# Patient Record
Sex: Female | Born: 1980
Health system: Southern US, Community
[De-identification: ages and names within clinical notes are randomized; demographics above are authoritative.]

## PROBLEM LIST (undated history)

## (undated) DIAGNOSIS — E049 Nontoxic goiter, unspecified: Secondary | ICD-10-CM

## (undated) DIAGNOSIS — Z789 Other specified health status: Secondary | ICD-10-CM

## (undated) DIAGNOSIS — N809 Endometriosis, unspecified: Secondary | ICD-10-CM

## (undated) DIAGNOSIS — A630 Anogenital (venereal) warts: Secondary | ICD-10-CM

## (undated) DIAGNOSIS — Z8619 Personal history of other infectious and parasitic diseases: Secondary | ICD-10-CM

## (undated) DIAGNOSIS — N83209 Unspecified ovarian cyst, unspecified side: Secondary | ICD-10-CM

## (undated) DIAGNOSIS — R87629 Unspecified abnormal cytological findings in specimens from vagina: Secondary | ICD-10-CM

## (undated) DIAGNOSIS — N889 Noninflammatory disorder of cervix uteri, unspecified: Secondary | ICD-10-CM

## (undated) DIAGNOSIS — K529 Noninfective gastroenteritis and colitis, unspecified: Secondary | ICD-10-CM

## (undated) HISTORY — DX: Personal history of other infectious and parasitic diseases: Z86.19

## (undated) HISTORY — PX: BUNIONECTOMY: SHX129

## (undated) HISTORY — DX: Nontoxic goiter, unspecified: E04.9

## (undated) HISTORY — DX: Unspecified abnormal cytological findings in specimens from vagina: R87.629

## (undated) HISTORY — PX: WISDOM TOOTH EXTRACTION: SHX21

## (undated) HISTORY — DX: Unspecified ovarian cyst, unspecified side: N83.209

## (undated) HISTORY — PX: BREAST LUMPECTOMY: SHX2

---

## 2003-10-18 ENCOUNTER — Other Ambulatory Visit: Admission: RE | Admit: 2003-10-18 | Discharge: 2003-10-18 | Payer: Self-pay | Admitting: Obstetrics and Gynecology

## 2007-04-03 ENCOUNTER — Encounter: Admission: RE | Admit: 2007-04-03 | Discharge: 2007-04-03 | Payer: Self-pay | Admitting: Obstetrics and Gynecology

## 2007-04-28 ENCOUNTER — Emergency Department (HOSPITAL_COMMUNITY): Admission: EM | Admit: 2007-04-28 | Discharge: 2007-04-28 | Payer: Self-pay | Admitting: Family Medicine

## 2007-04-29 ENCOUNTER — Emergency Department (HOSPITAL_COMMUNITY): Admission: EM | Admit: 2007-04-29 | Discharge: 2007-04-29 | Payer: Self-pay | Admitting: Family Medicine

## 2009-07-21 ENCOUNTER — Encounter: Admission: RE | Admit: 2009-07-21 | Discharge: 2009-07-21 | Payer: Self-pay | Admitting: Obstetrics and Gynecology

## 2010-12-09 ENCOUNTER — Emergency Department (HOSPITAL_BASED_OUTPATIENT_CLINIC_OR_DEPARTMENT_OTHER)
Admission: EM | Admit: 2010-12-09 | Discharge: 2010-12-09 | Disposition: A | Discharge status: Home / Self Care | Payer: 59 | Attending: Emergency Medicine | Admitting: Emergency Medicine

## 2010-12-09 ENCOUNTER — Emergency Department (INDEPENDENT_AMBULATORY_CARE_PROVIDER_SITE_OTHER): Payer: 59

## 2010-12-09 DIAGNOSIS — R110 Nausea: Secondary | ICD-10-CM | POA: Insufficient documentation

## 2010-12-09 DIAGNOSIS — R1031 Right lower quadrant pain: Secondary | ICD-10-CM

## 2010-12-09 DIAGNOSIS — R109 Unspecified abdominal pain: Secondary | ICD-10-CM | POA: Insufficient documentation

## 2010-12-09 LAB — DIFFERENTIAL
Eosinophils Absolute: 0 10*3/uL (ref 0.0–0.7)
Eosinophils Relative: 0 % (ref 0–5)
Lymphs Abs: 1.5 10*3/uL (ref 0.7–4.0)
Monocytes Absolute: 1.2 10*3/uL — ABNORMAL HIGH (ref 0.1–1.0)

## 2010-12-09 LAB — URINALYSIS, ROUTINE W REFLEX MICROSCOPIC
Leukocytes, UA: NEGATIVE
Nitrite: NEGATIVE
Urine Glucose, Fasting: NEGATIVE mg/dL
pH: 6.5 (ref 5.0–8.0)

## 2010-12-09 LAB — COMPREHENSIVE METABOLIC PANEL
Albumin: 4.1 g/dL (ref 3.5–5.2)
Alkaline Phosphatase: 51 U/L (ref 39–117)
BUN: 11 mg/dL (ref 6–23)
Calcium: 9.3 mg/dL (ref 8.4–10.5)
Creatinine, Ser: 0.8 mg/dL (ref 0.4–1.2)
Glucose, Bld: 99 mg/dL (ref 70–99)
Potassium: 3.3 mEq/L — ABNORMAL LOW (ref 3.5–5.1)
Total Protein: 7.7 g/dL (ref 6.0–8.3)

## 2010-12-09 LAB — CBC
HCT: 35.7 % — ABNORMAL LOW (ref 36.0–46.0)
MCHC: 34.2 g/dL (ref 30.0–36.0)
MCV: 88.1 fL (ref 78.0–100.0)
Platelets: 235 10*3/uL (ref 150–400)
RDW: 11.7 % (ref 11.5–15.5)
WBC: 12.2 10*3/uL — ABNORMAL HIGH (ref 4.0–10.5)

## 2010-12-09 LAB — URINE MICROSCOPIC-ADD ON

## 2010-12-09 LAB — LIPASE, BLOOD: Lipase: 70 U/L (ref 23–300)

## 2010-12-09 LAB — PREGNANCY, URINE: Preg Test, Ur: NEGATIVE

## 2010-12-09 MED ORDER — IOHEXOL 300 MG/ML  SOLN
100.0000 mL | Freq: Once | INTRAMUSCULAR | Status: AC | PRN
  Administered 2010-12-09: 100 mL via INTRAVENOUS

## 2011-08-15 LAB — I-STAT 8, (EC8 V) (CONVERTED LAB)
Acid-Base Excess: 1
BUN: 13
Bicarbonate: 24.7 — ABNORMAL HIGH
Chloride: 104
Glucose, Bld: 96
HCT: 42
Hemoglobin: 14.3
Operator id: 247071
Potassium: 3.7
Sodium: 137
TCO2: 26
pCO2, Ven: 34.3 — ABNORMAL LOW
pH, Ven: 7.465 — ABNORMAL HIGH

## 2011-08-15 LAB — DIFFERENTIAL
Basophils Absolute: 0
Basophils Relative: 0
Eosinophils Absolute: 0.1
Eosinophils Relative: 1
Lymphocytes Relative: 12
Lymphs Abs: 0.8
Monocytes Absolute: 0.4
Monocytes Relative: 5
Neutro Abs: 5.6
Neutrophils Relative %: 82 — ABNORMAL HIGH

## 2011-08-15 LAB — POCT URINALYSIS DIP (DEVICE)
Bilirubin Urine: NEGATIVE
Glucose, UA: NEGATIVE
Ketones, ur: NEGATIVE
Nitrite: NEGATIVE
Operator id: 247071
Protein, ur: NEGATIVE
Specific Gravity, Urine: 1.02
Urobilinogen, UA: 0.2
pH: 7.5

## 2011-08-15 LAB — CBC
HCT: 36.6
Hemoglobin: 12.4
MCHC: 33.9
MCV: 88.9
Platelets: 253
RBC: 4.12
RDW: 11.9
WBC: 6.9

## 2011-08-15 LAB — SEDIMENTATION RATE: Sed Rate: 16

## 2011-08-15 LAB — POCT RAPID STREP A: Streptococcus, Group A Screen (Direct): NEGATIVE

## 2011-12-04 ENCOUNTER — Other Ambulatory Visit: Payer: Self-pay | Admitting: Obstetrics and Gynecology

## 2011-12-04 DIAGNOSIS — N63 Unspecified lump in unspecified breast: Secondary | ICD-10-CM

## 2011-12-07 ENCOUNTER — Ambulatory Visit
Admission: RE | Admit: 2011-12-07 | Discharge: 2011-12-07 | Disposition: A | Discharge status: Home / Self Care | Payer: 59 | Source: Ambulatory Visit | Attending: Obstetrics and Gynecology | Admitting: Obstetrics and Gynecology

## 2011-12-07 DIAGNOSIS — N63 Unspecified lump in unspecified breast: Secondary | ICD-10-CM

## 2012-02-29 ENCOUNTER — Emergency Department (INDEPENDENT_AMBULATORY_CARE_PROVIDER_SITE_OTHER)
Admission: EM | Admit: 2012-02-29 | Discharge: 2012-02-29 | Disposition: A | Discharge status: Home / Self Care | Payer: 59 | Source: Home / Self Care | Attending: Emergency Medicine | Admitting: Emergency Medicine

## 2012-02-29 ENCOUNTER — Encounter (HOSPITAL_COMMUNITY): Payer: Self-pay | Admitting: *Deleted

## 2012-02-29 DIAGNOSIS — K2900 Acute gastritis without bleeding: Secondary | ICD-10-CM

## 2012-02-29 DIAGNOSIS — K2970 Gastritis, unspecified, without bleeding: Secondary | ICD-10-CM

## 2012-02-29 LAB — POCT URINALYSIS DIP (DEVICE)
Leukocytes, UA: NEGATIVE
Nitrite: NEGATIVE
Protein, ur: NEGATIVE mg/dL
pH: 6 (ref 5.0–8.0)

## 2012-02-29 LAB — POCT PREGNANCY, URINE: Preg Test, Ur: NEGATIVE

## 2012-02-29 LAB — POCT H PYLORI SCREEN: H. PYLORI SCREEN, POC: NEGATIVE

## 2012-02-29 MED ORDER — PANTOPRAZOLE SODIUM 40 MG PO TBEC: 40.0000 mg | DELAYED_RELEASE_TABLET | Freq: Every day | ORAL | Status: DC

## 2012-02-29 MED ORDER — SUCRALFATE 1 GM/10ML PO SUSP: 1.0000 g | Freq: Four times a day (QID) | ORAL | Status: DC

## 2012-02-29 MED ORDER — FAMOTIDINE 20 MG PO TABS: 20.0000 mg | ORAL_TABLET | Freq: Every day | ORAL | Status: DC

## 2012-02-29 NOTE — Discharge Instructions (Signed)
Gastritis Gastritis is an inflammation (the body's way of reacting to injury and/or infection) of the stomach. It is often caused by viral or bacterial (germ) infections. It can also be caused by chemicals (including alcohol) and medications. This illness may be associated with generalized malaise (feeling tired, not well), cramps, and fever. The illness may last 2 to 7 days. If symptoms of gastritis continue, gastroscopy (looking into the stomach with a telescope-like instrument), biopsy (taking tissue samples), and/or blood tests may be necessary to determine the cause. Antibiotics will not affect the illness unless there is a bacterial infection present. One common bacterial cause of gastritis is an organism known as H. Pylori. This can be treated with antibiotics. Other forms of gastritis are caused by too much acid in the stomach. They can be treated with medications such as H2 blockers and antacids. Home treatment is usually all that is needed. Young children will quickly become dehydrated (loss of body fluids) if vomiting and diarrhea are both present. Medications may be given to control nausea. Medications are usually not given for diarrhea unless especially bothersome. Some medications slow the removal of the virus from the gastrointestinal tract. This slows down the healing process. HOME CARE INSTRUCTIONS Home care instructions for nausea and vomiting:  For adults: drink small amounts of fluids often. Drink at least 2 quarts a day. Take sips frequently. Do not drink large amounts of fluid at one time. This may worsen the nausea.   Only take over-the-counter or prescription medicines for pain, discomfort, or fever as directed by your caregiver.   Drink clear liquids only. Those are anything you can see through such as water, broth, or soft drinks.   Once you are keeping clear liquids down, you may start full liquids, soups, juices, and ice cream or sherbet. Slowly add bland (plain, not spicy)  foods to your diet.  Home care instructions for diarrhea:  Diarrhea can be caused by bacterial infections or a virus. Your condition should improve with time, rest, fluids, and/or anti-diarrheal medication.   Until your diarrhea is under control, you should drink clear liquids often in small amounts. Clear liquids include: water, broth, jell-o water and weak tea.  Avoid:  Milk.   Fruits.   Tobacco.   Alcohol.   Extremely hot or cold fluids.   Too much intake of anything at one time.  When your diarrhea stops you may add the following foods, which help the stool to become more formed:  Rice.   Bananas.   Apples without skin.   Dry toast.  Once these foods are tolerated you may add low-fat yogurt and low-fat cottage cheese. They will help to restore the normal bacterial balance in your bowel. Wash your hands well to avoid spreading bacteria (germ) or virus. SEEK IMMEDIATE MEDICAL CARE IF:   You are unable to keep fluids down.   Vomiting or diarrhea become persistent (constant).   Abdominal pain develops, increases, or localizes. (Right sided pain can be appendicitis. Left sided pain in adults can be diverticulitis.)   You develop a fever (an oral temperature above 102 F (38.9 C)).   Diarrhea becomes excessive or contains blood or mucus.   You have excessive weakness, dizziness, fainting or extreme thirst.   You are not improving or you are getting worse.   You have any other questions or concerns.  Document Released: 10/09/2001 Document Revised: 10/04/2011 Document Reviewed: 10/15/2005 [employer] Patient Information 2012 [employer]. 

## 2012-02-29 NOTE — ED Provider Notes (Signed)
History     CSN: [id number]  Arrival date & time 02/29/12  1846   First MD Initiated Contact with Patient 02/29/12 1859      Chief Complaint  Patient presents with  . Abdominal Pain    (Consider location/radiation/quality/duration/timing/severity/associated sxs/prior treatment) HPI Comments: Patient reports burning, left upper/gastric pain, nausea, decreased appetite starting several days ago. Last night states it woke her up from sleep, and she reports increased belching. She states she has this every month around menses for the past year.  otherwise does not have any symptoms during the rest of the month. States her symptoms are worse with eating red foods, such as tomatoes, ketchup, red wine, etc. and with ibuprofen. She has decreased her intake of these, with some improvement this month. Chest tried some Pepto-Bismol earlier today with significant relief. No vomiting, fevers, abdominal distention. No chest pain coughing, wheezing, shortness breath, sore throat. No urinary or vaginal complaints. She is currently on menses. Last bowel movement was today, was normal for her. She does not smoke. Has no history of gastritis, H. pylori infection.   ROS as noted in HPI. All other ROS negative.   Patient is a 32 y.o. female presenting with abdominal pain. The history is provided by the patient. No language interpreter was used.  Abdominal Pain The primary symptoms of the illness include abdominal pain.    History reviewed. No pertinent past medical history.  Past Surgical History  Procedure Date  . Breast lumpectomy     Family History  Problem Relation Age of Onset  . Diabetes Mother   . Hypertension Mother     History  Substance Use Topics  . Smoking status: Never Smoker   . Smokeless tobacco: Not on file  . Alcohol Use: Yes     occasonally    OB History    Grav Para Term Preterm Abortions TAB SAB Ect Mult Living                  Review of Systems  Gastrointestinal:  Positive for abdominal pain.    Allergies  Review of patient's allergies indicates no known allergies.  Home Medications   Current Outpatient Rx  Name Route Sig Dispense Refill  . BISMUTH SUBSALICYLATE 262 MG/15ML PO SUSP Oral Take 15 mLs by mouth every 6 (six) hours as needed.    Marland Kitchen NAPROXEN SODIUM 220 MG PO TABS Oral Take 220 mg by mouth 2 (two) times daily with a meal.    . FAMOTIDINE 20 MG PO TABS Oral Take 1 tablet (20 mg total) by mouth daily. 20 tablet 0  . PANTOPRAZOLE SODIUM 40 MG PO TBEC Oral Take 1 tablet (40 mg total) by mouth daily. 20 tablet 0  . SUCRALFATE 1 GM/10ML PO SUSP Oral Take 10 mLs (1 g total) by mouth 4 (four) times daily. 10 mL before meals and before bedtime 240 mL 0    BP 125/77  Pulse 74  Temp(Src) 97.5 F (36.4 C) (Oral)  Resp 18  SpO2 100%  LMP 02/29/2012  Physical Exam  Nursing note and vitals reviewed. Constitutional: She is oriented to person, place, and time. She appears well-developed and well-nourished.  HENT:  Head: Normocephalic and atraumatic.  Eyes: Conjunctivae and EOM are normal.  Neck: Normal range of motion.  Cardiovascular: Normal rate, regular rhythm, normal heart sounds and intact distal pulses.   Pulmonary/Chest: Effort normal and breath sounds normal.  Abdominal: Soft. Normal appearance and bowel sounds are normal. She exhibits no distension  and no mass. There is no rebound and no guarding.    Musculoskeletal: Normal range of motion.  Neurological: She is alert and oriented to person, place, and time.  Skin: Skin is warm and dry.  Psychiatric: She has a normal mood and affect. Her behavior is normal. Judgment and thought content normal.    ED Course  Procedures (including critical care time)  Labs Reviewed  POCT URINALYSIS DIP (DEVICE) - Abnormal; Notable for the following:    Ketones, ur 15 (*)    Hgb urine dipstick LARGE (*)    All other components within normal limits  POCT H PYLORI SCREEN  POCT PREGNANCY, URINE     No results found.   1. Gastritis    Results for orders placed during the hospital encounter of 02/29/12  POCT H PYLORI SCREEN      Component Value Range   H. PYLORI SCREEN, POC NEGATIVE  NEGATIVE   POCT URINALYSIS DIP (DEVICE)      Component Value Range   Glucose, UA NEGATIVE  NEGATIVE (mg/dL)   Bilirubin Urine NEGATIVE  NEGATIVE    Ketones, ur 15 (*) NEGATIVE (mg/dL)   Specific Gravity, Urine 1.010  1.005 - 1.030    Hgb urine dipstick LARGE (*) NEGATIVE    pH 6.0  5.0 - 8.0    Protein, ur NEGATIVE  NEGATIVE (mg/dL)   Urobilinogen, UA 0.2  0.0 - 1.0 (mg/dL)   Nitrite NEGATIVE  NEGATIVE    Leukocytes, UA NEGATIVE  NEGATIVE   POCT PREGNANCY, URINE      Component Value Range   Preg Test, Ur NEGATIVE  NEGATIVE      MDM  Previous labs, records reviewed. Normal abdomen CT on ER evaluation for lower abdominal pain on 11/2010.  Abdomen benign. No evidence of surgical abdomen. H&P most consistent with gastritis, most likely from NSAID ingestion. She has already decrease the amount of NSAIDs she takes normally, and states that her symptoms are not as bad this month. H. pylori negative. Sending home with Carafate, H2, PPI. She'll follow with her PMD as needed. Patient agrees with plan  Luiz Blare, MD 02/29/12 2159

## 2012-02-29 NOTE — ED Notes (Signed)
Pt  Reports  Symptoms  Of  Belching  As  Well as  Burning  Sensation  In  Stomach  -  Pt  Reports  The  Symptoms      Have  Persisted  Since  yest  She  Reports  She  Has  Had  Similar  Symptoms  Before     -  She  Reports  The  Symptoms  Typical;ly  Occur  Around  Her  Period

## 2012-04-28 ENCOUNTER — Encounter (HOSPITAL_COMMUNITY): Payer: Self-pay | Admitting: *Deleted

## 2012-04-28 ENCOUNTER — Emergency Department (HOSPITAL_COMMUNITY)
Admission: EM | Admit: 2012-04-28 | Discharge: 2012-04-28 | Disposition: A | Discharge status: Home / Self Care | Payer: Self-pay | Attending: Emergency Medicine | Admitting: Emergency Medicine

## 2012-04-28 ENCOUNTER — Emergency Department (HOSPITAL_COMMUNITY): Payer: Self-pay

## 2012-04-28 DIAGNOSIS — R109 Unspecified abdominal pain: Secondary | ICD-10-CM | POA: Insufficient documentation

## 2012-04-28 DIAGNOSIS — N83209 Unspecified ovarian cyst, unspecified side: Secondary | ICD-10-CM | POA: Insufficient documentation

## 2012-04-28 DIAGNOSIS — N83201 Unspecified ovarian cyst, right side: Secondary | ICD-10-CM

## 2012-04-28 DIAGNOSIS — R112 Nausea with vomiting, unspecified: Secondary | ICD-10-CM | POA: Insufficient documentation

## 2012-04-28 HISTORY — DX: Noninfective gastroenteritis and colitis, unspecified: K52.9

## 2012-04-28 HISTORY — DX: Noninflammatory disorder of cervix uteri, unspecified: N88.9

## 2012-04-28 LAB — COMPREHENSIVE METABOLIC PANEL
Albumin: 4.4 g/dL (ref 3.5–5.2)
Alkaline Phosphatase: 35 U/L — ABNORMAL LOW (ref 39–117)
BUN: 7 mg/dL (ref 6–23)
Calcium: 9.8 mg/dL (ref 8.4–10.5)
Creatinine, Ser: 0.68 mg/dL (ref 0.50–1.10)
GFR calc Af Amer: >90 mL/min (ref >90)
Potassium: 3.6 mEq/L (ref 3.5–5.1)
Total Protein: 8.7 g/dL — ABNORMAL HIGH (ref 6.0–8.3)

## 2012-04-28 LAB — URINALYSIS, ROUTINE W REFLEX MICROSCOPIC
Bilirubin Urine: NEGATIVE
Ketones, ur: NEGATIVE mg/dL
Nitrite: NEGATIVE
Protein, ur: NEGATIVE mg/dL

## 2012-04-28 LAB — CBC
Hemoglobin: 13.1 g/dL (ref 12.0–15.0)
MCH: 29.5 pg (ref 26.0–34.0)
MCHC: 33.3 g/dL (ref 30.0–36.0)
MCV: 88.5 fL (ref 78.0–100.0)
Platelets: 297 10*3/uL (ref 150–400)

## 2012-04-28 LAB — LIPASE, BLOOD: Lipase: 28 U/L (ref 11–59)

## 2012-04-28 LAB — DIFFERENTIAL
Basophils Relative: 0 % (ref 0–1)
Eosinophils Absolute: 0 10*3/uL (ref 0.0–0.7)
Eosinophils Relative: 0 % (ref 0–5)
Monocytes Relative: 5 % (ref 3–12)
Neutrophils Relative %: 79 % — ABNORMAL HIGH (ref 43–77)

## 2012-04-28 LAB — WET PREP, GENITAL
Clue Cells Wet Prep HPF POC: NONE SEEN
Trich, Wet Prep: NONE SEEN
Yeast Wet Prep HPF POC: NONE SEEN

## 2012-04-28 LAB — URINE MICROSCOPIC-ADD ON

## 2012-04-28 MED ORDER — OXYCODONE-ACETAMINOPHEN 5-325 MG PO TABS: ORAL_TABLET | ORAL | Status: DC

## 2012-04-28 MED ORDER — CEFTRIAXONE SODIUM 250 MG IJ SOLR
250.0000 mg | Freq: Once | INTRAMUSCULAR | Status: AC
  Administered 2012-04-28: 250 mg via INTRAMUSCULAR
  Filled 2012-04-28: qty 250

## 2012-04-28 MED ORDER — DOXYCYCLINE HYCLATE 100 MG PO TABS
100.0000 mg | ORAL_TABLET | Freq: Once | ORAL | Status: AC
  Administered 2012-04-28: 100 mg via ORAL
  Filled 2012-04-28: qty 1

## 2012-04-28 MED ORDER — ONDANSETRON HCL 4 MG PO TABS: 4.0000 mg | ORAL_TABLET | Freq: Four times a day (QID) | ORAL | Status: AC

## 2012-04-28 MED ORDER — MORPHINE SULFATE 4 MG/ML IJ SOLN
4.0000 mg | Freq: Once | INTRAMUSCULAR | Status: AC
  Administered 2012-04-28: 4 mg via INTRAVENOUS
  Filled 2012-04-28: qty 1

## 2012-04-28 MED ORDER — SODIUM CHLORIDE 0.9 % IV BOLUS (SEPSIS)
1000.0000 mL | Freq: Once | INTRAVENOUS | Status: AC
  Administered 2012-04-28: 1000 mL via INTRAVENOUS

## 2012-04-28 MED ORDER — IOHEXOL 300 MG/ML  SOLN
100.0000 mL | Freq: Once | INTRAMUSCULAR | Status: AC | PRN
  Administered 2012-04-28: 100 mL via INTRAVENOUS

## 2012-04-28 MED ORDER — METRONIDAZOLE 500 MG PO TABS
500.0000 mg | ORAL_TABLET | Freq: Once | ORAL | Status: AC
  Administered 2012-04-28: 500 mg via ORAL
  Filled 2012-04-28: qty 1

## 2012-04-28 MED ORDER — SODIUM CHLORIDE 0.9 % IV SOLN
Freq: Once | INTRAVENOUS | Status: AC
  Administered 2012-04-28: 500 mL via INTRAVENOUS

## 2012-04-28 MED ORDER — DOXYCYCLINE HYCLATE 100 MG PO TABS: 100.0000 mg | ORAL_TABLET | Freq: Two times a day (BID) | ORAL | Status: AC

## 2012-04-28 MED ORDER — METRONIDAZOLE 500 MG PO TABS: 500.0000 mg | ORAL_TABLET | Freq: Two times a day (BID) | ORAL | Status: AC

## 2012-04-28 NOTE — Discharge Instructions (Signed)
Ovarian Cyst The ovaries are small organs that are on each side of the uterus. The ovaries are the organs that produce the female hormones, estrogen and progesterone. An ovarian cyst is a sac filled with fluid that can vary in its size. It is normal for a small cyst to form in women who are in the childbearing age and who have menstrual periods. This type of cyst is called a follicle cyst that becomes an ovulation cyst (corpus luteum cyst) after it produces the women's egg. It later goes away on its own if the woman does not become pregnant. There are other kinds of ovarian cysts that may cause problems and may need to be treated. The most serious problem is a cyst with cancer. It should be noted that menopausal women who have an ovarian cyst are at a higher risk of it being a cancer cyst. They should be evaluated very quickly, thoroughly and followed closely. This is especially true in menopausal women because of the high rate of ovarian cancer in women in menopause. CAUSES AND TYPES OF OVARIAN CYSTS:  FUNCTIONAL CYST: The follicle/corpus luteum cyst is a functional cyst that occurs every month during ovulation with the menstrual cycle. They go away with the next menstrual cycle if the woman does not get pregnant. Usually, there are no symptoms with a functional cyst.   ENDOMETRIOMA CYST: This cyst develops from the lining of the uterus tissue. This cyst gets in or on the ovary. It grows every month from the bleeding during the menstrual period. It is also called a "chocolate cyst" because it becomes filled with blood that turns brown. This cyst can cause pain in the lower abdomen during intercourse and with your menstrual period.   CYSTADENOMA CYST: This cyst develops from the cells on the outside of the ovary. They usually are not cancerous. They can get very big and cause lower abdomen pain and pain with intercourse. This type of cyst can twist on itself, cut off its blood supply and cause severe pain.  It also can easily rupture and cause a lot of pain.   DERMOID CYST: This type of cyst is sometimes found in both ovaries. They are found to have different kinds of body tissue in the cyst. The tissue includes skin, teeth, hair, and/or cartilage. They usually do not have symptoms unless they get very big. Dermoid cysts are rarely cancerous.   POLYCYSTIC OVARY: This is a rare condition with hormone problems that produces many small cysts on both ovaries. The cysts are follicle-like cysts that never produce an egg and become a corpus luteum. It can cause an increase in body weight, infertility, acne, increase in body and facial hair and lack of menstrual periods or rare menstrual periods. Many women with this problem develop type 2 diabetes. The exact cause of this problem is unknown. A polycystic ovary is rarely cancerous.   THECA LUTEIN CYST: Occurs when too much hormone (human chorionic gonadotropin) is produced and over-stimulates the ovaries to produce an egg. They are frequently seen when doctors stimulate the ovaries for invitro-fertilization (test tube babies).   LUTEOMA CYST: This cyst is seen during pregnancy. Rarely it can cause an obstruction to the birth canal during labor and delivery. They usually go away after delivery.  SYMPTOMS   Pelvic pain or pressure.   Pain during sexual intercourse.   Increasing girth (swelling) of the abdomen.   Abnormal menstrual periods.   Increasing pain with menstrual periods.   You stop having   menstrual periods and you are not pregnant.  DIAGNOSIS  The diagnosis can be made during:  Routine or annual pelvic examination (common).   Ultrasound.   X-ray of the pelvis.   CT Scan.   MRI.   Blood tests.  TREATMENT   Treatment may only be to follow the cyst monthly for 2 to 3 months with your caregiver. Many go away on their own, especially functional cysts.   May be aspirated (drained) with a long needle with ultrasound, or by laparoscopy  (inserting a tube into the pelvis through a small incision).   The whole cyst can be removed by laparoscopy.   Sometimes the cyst may need to be removed through an incision in the lower abdomen.   Hormone treatment is sometimes used to help dissolve certain cysts.   Birth control pills are sometimes used to help dissolve certain cysts.  HOME CARE INSTRUCTIONS  Follow your caregiver's advice regarding:  Medicine.   Follow up visits to evaluate and treat the cyst.   You may need to come back or make an appointment with another caregiver, to find the exact cause of your cyst, if your caregiver is not a gynecologist.   Get your yearly and recommended pelvic examinations and Pap tests.   Let your caregiver know if you have had an ovarian cyst in the past.  SEEK MEDICAL CARE IF:   Your periods are late, irregular, they stop, or are painful.   Your stomach (abdomen) or pelvic pain does not go away.   Your stomach becomes larger or swollen.   You have pressure on your bladder or trouble emptying your bladder completely.   You have painful sexual intercourse.   You have feelings of fullness, pressure, or discomfort in your stomach.   You lose weight for no apparent reason.   You feel generally ill.   You become constipated.   You lose your appetite.   You develop acne.   You have an increase in body and facial hair.   You are gaining weight, without changing your exercise and eating habits.   You think you are pregnant.  SEEK IMMEDIATE MEDICAL CARE IF:   You have increasing abdominal pain.   You feel sick to your stomach (nausea) and/or vomit.   You develop a fever that comes on suddenly.   You develop abdominal pain during a bowel movement.   Your menstrual periods become heavier than usual.  Document Released: 10/15/2005 Document Revised: 10/04/2011 Document Reviewed: 08/18/2009 [employer] Patient Information 2012 [employer]. 

## 2012-04-28 NOTE — Progress Notes (Signed)
ED CM contacted by ED Rn faith to assist with medication assistance Spoke with Sue Lush in [place] pharmacy to confirm pt is eligible for [facility] medication indigent assistance program non narcotic mediations. Rx to be sent to [facility] pharmacy ED RN Faith to be contacted when ready and pt needs to be assisted to retrieve medications

## 2012-04-28 NOTE — ED Notes (Addendum)
Pt to ultrasound  Pt alert and oriented x4. Respirations even and unlabored, bilateral symmetrical rise and fall of chest. Skin warm and dry. In no acute distress. Denies needs.

## 2012-04-28 NOTE — ED Notes (Signed)
Pt alert and oriented x4. Pt c/o of "stomach cramps for a few months". 4-5 months. Intermittent, at present 9/10 pain upper abdominal area. Reports pain is worse after bowel movement of sexual activity. Pt reports that it "does not burn when she pees, but creates pressure in her pelvic area" after she urinates. Reports "she gets a bloated feeling that causes pain". Pain will continue for 2 days after. Last bowel movement yesterday 6/30. Last night pt had slight nausea.   Pt has seen gastro specialist, test for gluten came back negative. Pt was put on prilosec and probiotic, pt reports neither one of those helped. Pt has had dx of gastroenteritis in past. Family hx of crohns disease.

## 2012-04-29 NOTE — ED Provider Notes (Signed)
History     CSN: [id number]  Arrival date & time 04/28/12  1054   First MD Initiated Contact with Patient 04/28/12 1207      Chief Complaint  Patient presents with  . stomach cramps     (Consider location/radiation/quality/duration/timing/severity/associated sxs/prior treatment) HPI Patient is a 31 yo G0 female who presents complaining of ongoing issues with chronic abdominal pain.  Pain today is over the entire lower abdomen as well as the epigastrium.  It is rated as a 10/10 and noted to be sharp.  Patient endorses nausea and vomiting but denies vaginal discharge.  She denies urinary symptoms or concerns she could be pregnant.  Patient has had no fevers, constipation, or diarrhea.  Patient has had symptoms intermittently over 1 year but today these were intolerable.  She has seen an OB-GYN and a gastroenterologist with no answers to her symptoms.  She does not have a PCP.  TVUS has been performed during work-up but no other imaging.  There are no other associated or modifying factors.  Past Medical History  Diagnosis Date  . Gastroenteritis     has been seen by specialist, unsure of official dx  . Cervix abnormality     precancerous cells    Past Surgical History  Procedure Date  . Breast lumpectomy     Family History  Problem Relation Age of Onset  . Diabetes Mother   . Hypertension Mother     History  Substance Use Topics  . Smoking status: Never Smoker   . Smokeless tobacco: Not on file  . Alcohol Use: Yes     occasonally    OB History    Grav Para Term Preterm Abortions TAB SAB Ect Mult Living                  Review of Systems  Constitutional: Negative.   HENT: Negative.   Eyes: Negative.   Respiratory: Negative.   Cardiovascular: Negative.   Gastrointestinal: Positive for nausea, vomiting and abdominal pain.  Genitourinary: Negative.   Musculoskeletal: Negative.   Skin: Negative.   Neurological: Negative.   Hematological: Negative.     Psychiatric/Behavioral: Negative.   All other systems reviewed and are negative.    Allergies  Review of patient's allergies indicates no known allergies.  Home Medications   Current Outpatient Rx  Name Route Sig Dispense Refill  . BIOTIN 10 MG PO TABS Oral Take 10 mg by mouth daily.    Marland Kitchen FOLIC ACID 1 MG PO TABS Oral Take 1 mg by mouth daily.    Marland Kitchen DEPO-PROVERA IM Intramuscular Inject 1 each into the muscle every 3 (three) months.    . ADULT MULTIVITAMIN W/MINERALS CH Oral Take 1 tablet by mouth daily.    Marland Kitchen PROBIOTIC DAILY PO Oral Take 1 tablet by mouth daily.    Marland Kitchen DOXYCYCLINE HYCLATE 100 MG PO TABS Oral Take 1 tablet (100 mg total) by mouth 2 (two) times daily. 28 tablet 0  . METRONIDAZOLE 500 MG PO TABS Oral Take 1 tablet (500 mg total) by mouth 2 (two) times daily. 28 tablet 0  . ONDANSETRON HCL 4 MG PO TABS Oral Take 1 tablet (4 mg total) by mouth every 6 (six) hours. 12 tablet 0  . OXYCODONE-ACETAMINOPHEN 5-325 MG PO TABS  Take 1-2 tabs by mouth every 6 hours as needed for pain. 30 tablet 0    BP 115/69  Pulse 68  Temp 99 F (37.2 C) (Oral)  Resp 18  SpO2 100%  LMP 04/23/2012  Physical Exam  Nursing note and vitals reviewed. GEN: Well-developed, well-nourished female in no distress HEENT: Atraumatic, normocephalic. EYES: PERRLA BL, no scleral icterus. NECK: Trachea midline, no meningismus CV: regular rate and rhythm. No murmurs, rubs, or gallops PULM: No respiratory distress.  No crackles, wheezes, or rales. GI: soft, bilateral lower abdominal pain, slight epigastric pain. No guarding, rebound. + bowel sounds  GU: speculum exam with minimal physiologic discharge. No CMT or adnexal tenderness Neuro: cranial nerves grossly 2-12 intact, no abnormalities of strength or sensation, A and O x 3 MSK: Patient moves all 4 extremities symmetrically, no deformity, edema, or injury noted Skin: No rashes petechiae, purpura, or jaundice Psych: no abnormality of mood   ED Course   Procedures (including critical care time)  Labs Reviewed  URINALYSIS, ROUTINE W REFLEX MICROSCOPIC - Abnormal; Notable for the following:    Hgb urine dipstick TRACE (*)     Leukocytes, UA TRACE (*)     All other components within normal limits  CBC - Abnormal; Notable for the following:    WBC 14.0 (*)     All other components within normal limits  DIFFERENTIAL - Abnormal; Notable for the following:    Neutrophils Relative 79 (*)     Neutro Abs 11.0 (*)     All other components within normal limits  COMPREHENSIVE METABOLIC PANEL - Abnormal; Notable for the following:    Total Protein 8.7 (*)     Alkaline Phosphatase 35 (*)     All other components within normal limits  WET PREP, GENITAL - Abnormal; Notable for the following:    WBC, Wet Prep HPF POC RARE (*)     All other components within normal limits  URINE MICROSCOPIC-ADD ON - Abnormal; Notable for the following:    Squamous Epithelial / LPF FEW (*)     All other components within normal limits  POCT PREGNANCY, URINE  LIPASE, BLOOD  GC/CHLAMYDIA PROBE AMP, GENITAL   US Transvaginal Non-ob  04/28/2012  *RADIOLOGY REPORT*  Clinical Data:  Lower abdominal pain, abnormal CT pelvis demonstrating bilateral adnexal cystic structures  TRANSABDOMINAL AND TRANSVAGINAL ULTRASOUND OF PELVIS DOPPLER ULTRASOUND OF OVARIES  Technique:  Both transabdominal and transvaginal ultrasound examinations of the pelvis were performed. Transabdominal technique was performed for global imaging of the pelvis including uterus, ovaries, adnexal regions, and pelvic cul-de-sac.  It was necessary to proceed with endovaginal exam following the transabdominal exam to visualize and characterize bilateral ovarian abnormalities.  CT pelvis 04/28/2012  Color and duplex Doppler ultrasound was utilized to evaluate blood flow to the ovaries.  Comparison:  04/28/2012 CT pelvis  Findings:  Uterus:  7.7 cm length by 4.2 cm AP by 5.0 cm transverse.  Normal position and  morphology without mass.  Endometrium:  6 mm thick, normal.  No endometrial fluid.  Right ovary: 6.1 x 4.0 x 5.0 cm.  Hypoechoic nodule identified within the right ovary, 3.8 x 3.4 x 3.6 cm in size, containing diffuse homogeneous low level echogenicity.  This could represent a hemorrhagic cyst or endometrioma.  No hydrosalpinx identified. Blood flow present within right ovary on color Doppler imaging.  Left ovary:   8.0 x 4.4 x 5.8 cm.  Complex hypoechoic nodule within left ovary 5.9 x 4.2 x 4.4 cm, containing diffuse homogeneous low level echogenicity.  Few tiny echogenic foci seen in adjacent ovarian tissue question tiny calcifications.  This could represent hemorrhagic cyst or endometrioma.  No hydrosalpinx identified. Blood flow present within left ovary  on color Doppler imaging.  Pulsed Doppler evaluation demonstrates normal low-resistance arterial and venous waveforms in both ovaries.  No free pelvic fluid or additional pelvic masses.  IMPRESSION: Bilateral ovarian enlargement secondary to large bilateral hypoechoic nodules containing diffuse low level internal echogenicity, measuring 3.8 x 3.4 x 3.6 cm on right and 5.9 x 4.2 x 4.4 cm on left. Favor these representing endometriomas though hemorrhagic cysts could have a similar appearance. Follow-up ultrasound recommended 6-12 months to reassess.  No sonographic evidence for ovarian torsion.  Original Report Authenticated By: Lollie Marrow, M.D.   US Pelvis Complete  04/28/2012  *RADIOLOGY REPORT*  Clinical Data:  Lower abdominal pain, abnormal CT pelvis demonstrating bilateral adnexal cystic structures  TRANSABDOMINAL AND TRANSVAGINAL ULTRASOUND OF PELVIS DOPPLER ULTRASOUND OF OVARIES  Technique:  Both transabdominal and transvaginal ultrasound examinations of the pelvis were performed. Transabdominal technique was performed for global imaging of the pelvis including uterus, ovaries, adnexal regions, and pelvic cul-de-sac.  It was necessary to proceed with  endovaginal exam following the transabdominal exam to visualize and characterize bilateral ovarian abnormalities.  CT pelvis 04/28/2012  Color and duplex Doppler ultrasound was utilized to evaluate blood flow to the ovaries.  Comparison:  04/28/2012 CT pelvis  Findings:  Uterus:  7.7 cm length by 4.2 cm AP by 5.0 cm transverse.  Normal position and morphology without mass.  Endometrium:  6 mm thick, normal.  No endometrial fluid.  Right ovary: 6.1 x 4.0 x 5.0 cm.  Hypoechoic nodule identified within the right ovary, 3.8 x 3.4 x 3.6 cm in size, containing diffuse homogeneous low level echogenicity.  This could represent a hemorrhagic cyst or endometrioma.  No hydrosalpinx identified. Blood flow present within right ovary on color Doppler imaging.  Left ovary:   8.0 x 4.4 x 5.8 cm.  Complex hypoechoic nodule within left ovary 5.9 x 4.2 x 4.4 cm, containing diffuse homogeneous low level echogenicity.  Few tiny echogenic foci seen in adjacent ovarian tissue question tiny calcifications.  This could represent hemorrhagic cyst or endometrioma.  No hydrosalpinx identified. Blood flow present within left ovary on color Doppler imaging.  Pulsed Doppler evaluation demonstrates normal low-resistance arterial and venous waveforms in both ovaries.  No free pelvic fluid or additional pelvic masses.  IMPRESSION: Bilateral ovarian enlargement secondary to large bilateral hypoechoic nodules containing diffuse low level internal echogenicity, measuring 3.8 x 3.4 x 3.6 cm on right and 5.9 x 4.2 x 4.4 cm on left. Favor these representing endometriomas though hemorrhagic cysts could have a similar appearance. Follow-up ultrasound recommended 6-12 months to reassess.  No sonographic evidence for ovarian torsion.  Original Report Authenticated By: Lollie Marrow, M.D.   Ct Abdomen Pelvis W Contrast  04/28/2012  *RADIOLOGY REPORT*  Clinical Data: Lower abdominal pain, history of gastroenteritis  CT ABDOMEN AND PELVIS WITH CONTRAST   Technique:  Multidetector CT imaging of the abdomen and pelvis was performed following the standard protocol during bolus administration of intravenous contrast.  Contrast: OMNIPAQUE IOHEXOL 300 MG/ML  SOLN  Comparison: [facility] CT abdomen/pelvis dated 12/09/2010  Findings: Lung bases are clear.  Liver, spleen, pancreas, and adrenal glands are within normal limits.  Gallbladder is unremarkable.  No intrahepatic or extrahepatic ductal dilatation.  7 mm lesion in the posterior interpolar right kidney (series 2/image 21).  On the current study, the lesion has an apparent density of 110 HUs, which if accruate would be worrisome for enhancement.  The lesion measured approximately 40 HUs on the prior  study and may simply be hemorrhagic.  Regardless, it is indeterminate on the current study.  Left kidney is unremarkable.  No hydronephrosis.  No evidence of bowel obstruction.  Appendix is not discretely visualized but there are no inflammatory changes in the right lower quadrant to suggest acute appendicitis.  No evidence of abdominal aortic aneurysm.  No abdominopelvic ascites.  No suspicious abdominopelvic lymphadenopathy.  Uterus is unremarkable.  Large bilateral cystic ovarian lesions, measuring 4.2 x 4.6 cm on the left (series 2/856) and 5.0 x 2.9 cm on the right (series 2/image 60). Possible associated hydrosalpinx on the left (coronal image 37).  Bladder is within normal limits.  Visualized osseous structures are within normal limits.  IMPRESSION: Appendix is not discretely visualized.  No inflammatory changes in the right lower quadrant to suggest acute appendicitis.  Large bilateral cystic ovarian lesions, measuring 4.6 cm on the left and 5.0 cm on the right.  While likely physiologic, given the patient's symptoms, consider pelvic ultrasound for further characterization.  Otherwise, follow-up pelvic ultrasound is suggested in 8-12 weeks to document resolution.  7 mm lesion in the posterior  interpolar right kidney.  While statistically likely benign, the appearance on the current study raises the possibility of hemorrhage or enhancement.  Given small size, ultrasound is considered unlikely to be diagnostic.  Follow- up MRI abdomen with/without contrast is suggested in 6 months for further characterization.  Original Report Authenticated By: Charline Bills, M.D.   [place] Art/ven Flow Abd Pelv Doppler  04/28/2012  *RADIOLOGY REPORT*  Clinical Data:  Lower abdominal pain, abnormal CT pelvis demonstrating bilateral adnexal cystic structures  TRANSABDOMINAL AND TRANSVAGINAL ULTRASOUND OF PELVIS DOPPLER ULTRASOUND OF OVARIES  Technique:  Both transabdominal and transvaginal ultrasound examinations of the pelvis were performed. Transabdominal technique was performed for global imaging of the pelvis including uterus, ovaries, adnexal regions, and pelvic cul-de-sac.  It was necessary to proceed with endovaginal exam following the transabdominal exam to visualize and characterize bilateral ovarian abnormalities.  CT pelvis 04/28/2012  Color and duplex Doppler ultrasound was utilized to evaluate blood flow to the ovaries.  Comparison:  04/28/2012 CT pelvis  Findings:  Uterus:  7.7 cm length by 4.2 cm AP by 5.0 cm transverse.  Normal position and morphology without mass.  Endometrium:  6 mm thick, normal.  No endometrial fluid.  Right ovary: 6.1 x 4.0 x 5.0 cm.  Hypoechoic nodule identified within the right ovary, 3.8 x 3.4 x 3.6 cm in size, containing diffuse homogeneous low level echogenicity.  This could represent a hemorrhagic cyst or endometrioma.  No hydrosalpinx identified. Blood flow present within right ovary on color Doppler imaging.  Left ovary:   8.0 x 4.4 x 5.8 cm.  Complex hypoechoic nodule within left ovary 5.9 x 4.2 x 4.4 cm, containing diffuse homogeneous low level echogenicity.  Few tiny echogenic foci seen in adjacent ovarian tissue question tiny calcifications.  This could represent hemorrhagic  cyst or endometrioma.  No hydrosalpinx identified. Blood flow present within left ovary on color Doppler imaging.  Pulsed Doppler evaluation demonstrates normal low-resistance arterial and venous waveforms in both ovaries.  No free pelvic fluid or additional pelvic masses.  IMPRESSION: Bilateral ovarian enlargement secondary to large bilateral hypoechoic nodules containing diffuse low level internal echogenicity, measuring 3.8 x 3.4 x 3.6 cm on right and 5.9 x 4.2 x 4.4 cm on left. Favor these representing endometriomas though hemorrhagic cysts could have a similar appearance. Follow-up ultrasound recommended 6-12 months to reassess.  No sonographic evidence for  ovarian torsion.  Original Report Authenticated By: Lollie Marrow, M.D.     1. Bilateral ovarian cysts   2. Abdominal pain       MDM  Patient was evaluated by myself.  His acute symptoms were treated with zofran, morphine, and NS.  Lab work-up showed leukocytosis of 14.  CT was performed and showed enlarged ovaries with possible hydrosalpinx as well as a small renal cyst.  Patient was notified of results and TVUS was ordered.  There was no evidence of torsion on this.  Patient was also told she will need to follow-up with a MR of the abdomen in 6 months for this.  Patient was discussed with Dr. Estanislado Pandy who was on for gynecology given that she had BL ovarian cysts and their size.  She recommended treating for PID given CT results and leukocytosis.  Patient was given Rocephin and started on flagyl and doxy for 14 days per these reccs.  Patient will follow up with her gynecologist, Dr. Cherly Hensen for this.  Patient was discharged in good condition with antibiotic prescriptions as well as a short course of pain meds and zofran.  Patient was discharged in good condition.        Cyndra Numbers, MD 04/29/12 2250

## 2012-08-09 ENCOUNTER — Encounter (HOSPITAL_COMMUNITY): Payer: Self-pay | Admitting: Pharmacist

## 2012-08-12 ENCOUNTER — Encounter (HOSPITAL_COMMUNITY): Payer: Self-pay

## 2012-08-12 ENCOUNTER — Encounter (HOSPITAL_COMMUNITY)
Admission: RE | Admit: 2012-08-12 | Discharge: 2012-08-12 | Disposition: A | Discharge status: Home / Self Care | Payer: 59 | Source: Ambulatory Visit | Attending: Obstetrics & Gynecology | Admitting: Obstetrics & Gynecology

## 2012-08-12 HISTORY — DX: Other specified health status: Z78.9

## 2012-08-12 LAB — SURGICAL PCR SCREEN
MRSA, PCR: NEGATIVE
Staphylococcus aureus: NEGATIVE

## 2012-08-12 LAB — CBC
HCT: 38.3 % (ref 36.0–46.0)
Hemoglobin: 12.7 g/dL (ref 12.0–15.0)
RBC: 4.27 MIL/uL (ref 3.87–5.11)
WBC: 7.2 10*3/uL (ref 4.0–10.5)

## 2012-08-12 NOTE — Patient Instructions (Signed)
Your procedure is scheduled on:08/21/12  Enter through the Main Entrance at : 6am  Pick up desk phone and dial [phone] and inform us of your arrival.  Please call [phone] if you have any problems the morning of surgery.  Remember: Do not eat or drink after midnight: Wed.   DO NOT wear jewelry, eye make-up, lipstick,body lotion, or dark fingernail polish. Do not shave for 48 hours prior to surgery.   Patients discharged on the day of surgery will not be allowed to drive home.

## 2012-08-13 ENCOUNTER — Other Ambulatory Visit: Payer: Self-pay | Admitting: Obstetrics & Gynecology

## 2012-08-21 ENCOUNTER — Ambulatory Visit (HOSPITAL_COMMUNITY): Payer: 59 | Admitting: Anesthesiology

## 2012-08-21 ENCOUNTER — Encounter (HOSPITAL_COMMUNITY): Payer: Self-pay | Admitting: Anesthesiology

## 2012-08-21 ENCOUNTER — Encounter (HOSPITAL_COMMUNITY)
Admission: RE | Disposition: A | Discharge status: Home / Self Care | Payer: Self-pay | Source: Ambulatory Visit | Attending: Obstetrics & Gynecology

## 2012-08-21 ENCOUNTER — Ambulatory Visit (HOSPITAL_COMMUNITY)
Admission: RE | Admit: 2012-08-21 | Discharge: 2012-08-21 | Disposition: A | Discharge status: Home / Self Care | Payer: 59 | Source: Ambulatory Visit | Attending: Obstetrics & Gynecology | Admitting: Obstetrics & Gynecology

## 2012-08-21 DIAGNOSIS — N803 Endometriosis of pelvic peritoneum, unspecified: Secondary | ICD-10-CM | POA: Insufficient documentation

## 2012-08-21 DIAGNOSIS — N946 Dysmenorrhea, unspecified: Secondary | ICD-10-CM | POA: Insufficient documentation

## 2012-08-21 DIAGNOSIS — N80109 Endometriosis of ovary, unspecified side, unspecified depth: Secondary | ICD-10-CM | POA: Insufficient documentation

## 2012-08-21 DIAGNOSIS — N801 Endometriosis of ovary: Secondary | ICD-10-CM | POA: Insufficient documentation

## 2012-08-21 DIAGNOSIS — N809 Endometriosis, unspecified: Secondary | ICD-10-CM

## 2012-08-21 DIAGNOSIS — N949 Unspecified condition associated with female genital organs and menstrual cycle: Secondary | ICD-10-CM | POA: Insufficient documentation

## 2012-08-21 DIAGNOSIS — N83209 Unspecified ovarian cyst, unspecified side: Secondary | ICD-10-CM | POA: Insufficient documentation

## 2012-08-21 HISTORY — PX: OVARIAN CYST REMOVAL: SHX89

## 2012-08-21 LAB — BASIC METABOLIC PANEL
BUN: 11 mg/dL (ref 6–23)
CO2: 23 mEq/L (ref 19–32)
Chloride: 105 mEq/L (ref 96–112)
Glucose, Bld: 93 mg/dL (ref 70–99)
Potassium: 3.9 mEq/L (ref 3.5–5.1)
Sodium: 139 mEq/L (ref 135–145)

## 2012-08-21 SURGERY — ROBOTIC ASSISTED LAPAROSCOPIC LYSIS OF ADHESION
Anesthesia: General | Site: Abdomen | Wound class: Clean Contaminated

## 2012-08-21 MED ORDER — ROCURONIUM BROMIDE 100 MG/10ML IV SOLN
INTRAVENOUS | Status: DC | PRN
  Administered 2012-08-21: 40 mg via INTRAVENOUS
  Administered 2012-08-21 (×2): 10 mg via INTRAVENOUS

## 2012-08-21 MED ORDER — LIDOCAINE HCL (CARDIAC) 20 MG/ML IV SOLN
INTRAVENOUS | Status: AC
  Filled 2012-08-21: qty 5

## 2012-08-21 MED ORDER — FENTANYL CITRATE 0.05 MG/ML IJ SOLN
25.0000 ug | INTRAMUSCULAR | Status: DC | PRN
  Administered 2012-08-21 (×2): 50 ug via INTRAVENOUS

## 2012-08-21 MED ORDER — OXYCODONE-ACETAMINOPHEN 5-325 MG PO TABS
ORAL_TABLET | ORAL | Status: AC
  Administered 2012-08-21: 1 via ORAL
  Filled 2012-08-21: qty 1

## 2012-08-21 MED ORDER — PROPOFOL 10 MG/ML IV EMUL
INTRAVENOUS | Status: DC | PRN
  Administered 2012-08-21: 150 mg via INTRAVENOUS

## 2012-08-21 MED ORDER — DEXAMETHASONE SODIUM PHOSPHATE 10 MG/ML IJ SOLN
INTRAMUSCULAR | Status: AC
  Filled 2012-08-21: qty 1

## 2012-08-21 MED ORDER — KETOROLAC TROMETHAMINE 30 MG/ML IJ SOLN
INTRAMUSCULAR | Status: DC | PRN
  Administered 2012-08-21: 30 mg via INTRAVENOUS

## 2012-08-21 MED ORDER — MIDAZOLAM HCL 2 MG/2ML IJ SOLN: 0.5000 mg | Freq: Once | INTRAMUSCULAR | Status: DC | PRN

## 2012-08-21 MED ORDER — MEPERIDINE HCL 25 MG/ML IJ SOLN: 6.2500 mg | INTRAMUSCULAR | Status: DC | PRN

## 2012-08-21 MED ORDER — CEFAZOLIN SODIUM-DEXTROSE 2-3 GM-% IV SOLR
INTRAVENOUS | Status: AC
  Filled 2012-08-21: qty 50

## 2012-08-21 MED ORDER — ROPIVACAINE HCL 5 MG/ML IJ SOLN
INTRAMUSCULAR | Status: AC
  Filled 2012-08-21: qty 60

## 2012-08-21 MED ORDER — GLYCOPYRROLATE 0.2 MG/ML IJ SOLN
INTRAMUSCULAR | Status: DC | PRN
  Administered 2012-08-21: 0.6 mg via INTRAVENOUS

## 2012-08-21 MED ORDER — DOCUSATE SODIUM 100 MG PO CAPS: 100.0000 mg | ORAL_CAPSULE | Freq: Two times a day (BID) | ORAL | Status: DC

## 2012-08-21 MED ORDER — ONDANSETRON HCL 4 MG/2ML IJ SOLN
INTRAMUSCULAR | Status: AC
  Filled 2012-08-21: qty 2

## 2012-08-21 MED ORDER — NEOSTIGMINE METHYLSULFATE 1 MG/ML IJ SOLN
INTRAMUSCULAR | Status: AC
  Filled 2012-08-21: qty 10

## 2012-08-21 MED ORDER — BUPIVACAINE HCL (PF) 0.25 % IJ SOLN
INTRAMUSCULAR | Status: AC
  Filled 2012-08-21: qty 30

## 2012-08-21 MED ORDER — IBUPROFEN 200 MG PO TABS: 600.0000 mg | ORAL_TABLET | Freq: Four times a day (QID) | ORAL | Status: DC | PRN

## 2012-08-21 MED ORDER — LIDOCAINE HCL (CARDIAC) 20 MG/ML IV SOLN
INTRAVENOUS | Status: DC | PRN
  Administered 2012-08-21: 50 mg via INTRAVENOUS
  Administered 2012-08-21: 30 mg via INTRAVENOUS

## 2012-08-21 MED ORDER — SCOPOLAMINE 1 MG/3DAYS TD PT72
MEDICATED_PATCH | TRANSDERMAL | Status: AC
  Filled 2012-08-21: qty 1

## 2012-08-21 MED ORDER — KETOROLAC TROMETHAMINE 30 MG/ML IJ SOLN: 15.0000 mg | Freq: Once | INTRAMUSCULAR | Status: DC | PRN

## 2012-08-21 MED ORDER — LACTATED RINGERS IV SOLN
INTRAVENOUS | Status: DC
  Administered 2012-08-21 (×2): via INTRAVENOUS

## 2012-08-21 MED ORDER — DEXAMETHASONE SODIUM PHOSPHATE 4 MG/ML IJ SOLN
INTRAMUSCULAR | Status: DC | PRN
  Administered 2012-08-21: 8 mg via INTRAVENOUS

## 2012-08-21 MED ORDER — OXYCODONE-ACETAMINOPHEN 5-325 MG PO TABS
1.0000 | ORAL_TABLET | ORAL | Status: DC | PRN
  Administered 2012-08-21: 1 via ORAL

## 2012-08-21 MED ORDER — PROPOFOL 10 MG/ML IV EMUL
INTRAVENOUS | Status: AC
  Filled 2012-08-21: qty 20

## 2012-08-21 MED ORDER — FENTANYL CITRATE 0.05 MG/ML IJ SOLN
INTRAMUSCULAR | Status: AC
  Filled 2012-08-21: qty 5

## 2012-08-21 MED ORDER — PROMETHAZINE HCL 25 MG/ML IJ SOLN: 6.2500 mg | INTRAMUSCULAR | Status: DC | PRN

## 2012-08-21 MED ORDER — NEOSTIGMINE METHYLSULFATE 1 MG/ML IJ SOLN
INTRAMUSCULAR | Status: DC | PRN
  Administered 2012-08-21: 3 mg via INTRAVENOUS

## 2012-08-21 MED ORDER — ROCURONIUM BROMIDE 50 MG/5ML IV SOLN
INTRAVENOUS | Status: AC
  Filled 2012-08-21: qty 2

## 2012-08-21 MED ORDER — ARTIFICIAL TEARS OP OINT
TOPICAL_OINTMENT | OPHTHALMIC | Status: AC
  Filled 2012-08-21: qty 3.5

## 2012-08-21 MED ORDER — ONDANSETRON HCL 4 MG/2ML IJ SOLN
INTRAMUSCULAR | Status: DC | PRN
  Administered 2012-08-21: 4 mg via INTRAVENOUS

## 2012-08-21 MED ORDER — GLYCOPYRROLATE 0.2 MG/ML IJ SOLN
INTRAMUSCULAR | Status: AC
  Filled 2012-08-21: qty 2

## 2012-08-21 MED ORDER — FENTANYL CITRATE 0.05 MG/ML IJ SOLN
INTRAMUSCULAR | Status: DC | PRN
  Administered 2012-08-21 (×4): 50 ug via INTRAVENOUS

## 2012-08-21 MED ORDER — FENTANYL CITRATE 0.05 MG/ML IJ SOLN
INTRAMUSCULAR | Status: AC
  Administered 2012-08-21: 50 ug via INTRAVENOUS
  Filled 2012-08-21: qty 2

## 2012-08-21 MED ORDER — KETOROLAC TROMETHAMINE 30 MG/ML IJ SOLN
INTRAMUSCULAR | Status: AC
  Filled 2012-08-21: qty 1

## 2012-08-21 MED ORDER — MIDAZOLAM HCL 2 MG/2ML IJ SOLN
INTRAMUSCULAR | Status: AC
  Filled 2012-08-21: qty 2

## 2012-08-21 MED ORDER — CEFAZOLIN SODIUM-DEXTROSE 2-3 GM-% IV SOLR
2.0000 g | INTRAVENOUS | Status: AC
  Administered 2012-08-21: 2 g via INTRAVENOUS

## 2012-08-21 MED ORDER — MIDAZOLAM HCL 5 MG/5ML IJ SOLN
INTRAMUSCULAR | Status: DC | PRN
  Administered 2012-08-21: 2 mg via INTRAVENOUS

## 2012-08-21 MED ORDER — OXYCODONE-ACETAMINOPHEN 5-325 MG PO TABS: 1.0000 | ORAL_TABLET | ORAL | Status: DC | PRN

## 2012-08-21 SURGICAL SUPPLY — 61 items
ADH SKN CLS APL DERMABOND .7 (GAUZE/BANDAGES/DRESSINGS) ×3
BAG URINE DRAINAGE (UROLOGICAL SUPPLIES) ×4 IMPLANT
BARRIER ADHS 3X4 INTERCEED (GAUZE/BANDAGES/DRESSINGS) ×2 IMPLANT
BRR ADH 4X3 ABS CNTRL BYND (GAUZE/BANDAGES/DRESSINGS) ×3
CABLE HIGH FREQUENCY MONO STRZ (ELECTRODE) ×4 IMPLANT
CATH FOLEY 3WAY  5CC 16FR (CATHETERS) ×1
CATH FOLEY 3WAY 5CC 16FR (CATHETERS) ×3 IMPLANT
CATH ROBINSON RED A/P 16FR (CATHETERS) ×4 IMPLANT
CHLORAPREP W/TINT 26ML (MISCELLANEOUS) ×4 IMPLANT
CLOTH BEACON ORANGE TIMEOUT ST (SAFETY) ×4 IMPLANT
CONT PATH 16OZ SNAP LID 3702 (MISCELLANEOUS) ×4 IMPLANT
COVER MAYO STAND STRL (DRAPES) ×4 IMPLANT
COVER TABLE BACK 60X90 (DRAPES) ×8 IMPLANT
COVER TIP SHEARS 8 DVNC (MISCELLANEOUS) ×3 IMPLANT
COVER TIP SHEARS 8MM DA VINCI (MISCELLANEOUS) ×1
DECANTER SPIKE VIAL GLASS SM (MISCELLANEOUS) ×4 IMPLANT
DERMABOND ADVANCED (GAUZE/BANDAGES/DRESSINGS) ×1
DERMABOND ADVANCED .7 DNX12 (GAUZE/BANDAGES/DRESSINGS) ×3 IMPLANT
DRAPE HUG U DISPOSABLE (DRAPE) ×4 IMPLANT
DRAPE LG THREE QUARTER DISP (DRAPES) ×8 IMPLANT
DRAPE WARM FLUID 44X44 (DRAPE) ×4 IMPLANT
ELECT REM PT RETURN 9FT ADLT (ELECTROSURGICAL) ×4
ELECTRODE REM PT RTRN 9FT ADLT (ELECTROSURGICAL) ×3 IMPLANT
EVACUATOR SMOKE 8.L (FILTER) ×4 IMPLANT
GLOVE BIO SURGEON STRL SZ7 (GLOVE) ×16 IMPLANT
GLOVE BIOGEL PI IND STRL 7.0 (GLOVE) ×12 IMPLANT
GLOVE BIOGEL PI INDICATOR 7.0 (GLOVE) ×4
GLOVE ECLIPSE 6.5 STRL STRAW (GLOVE) ×12 IMPLANT
GOWN PREVENTION PLUS LG XLONG (DISPOSABLE) ×10 IMPLANT
GOWN STRL REIN XL XLG (GOWN DISPOSABLE) ×24 IMPLANT
KIT ACCESSORY DA VINCI DISP (KITS) ×1
KIT ACCESSORY DVNC DISP (KITS) ×3 IMPLANT
MANIPULATOR UTERINE 4.5 ZUMI (MISCELLANEOUS) ×4 IMPLANT
NDL INSUFFLATION 14GA 120MM (NEEDLE) IMPLANT
NEEDLE INSUFFLATION 14GA 120MM (NEEDLE) ×4 IMPLANT
NS IRRIG 1000ML POUR BTL (IV SOLUTION) ×4 IMPLANT
OCCLUDER COLPOPNEUMO (BALLOONS) ×4 IMPLANT
PACK LAPAROSCOPY BASIN (CUSTOM PROCEDURE TRAY) ×4 IMPLANT
PACK LAVH (CUSTOM PROCEDURE TRAY) ×4 IMPLANT
PAD PREP 24X48 CUFFED NSTRL (MISCELLANEOUS) ×8 IMPLANT
PLUG CATH AND CAP STER (CATHETERS) ×4 IMPLANT
PROTECTOR NERVE ULNAR (MISCELLANEOUS) ×8 IMPLANT
SET IRRIG TUBING LAPAROSCOPIC (IRRIGATION / IRRIGATOR) ×4 IMPLANT
SHEET LAVH (DRAPES) ×2 IMPLANT
SOLUTION ELECTROLUBE (MISCELLANEOUS) ×4 IMPLANT
SURGIFLO W/THROMBIN 8M KIT (HEMOSTASIS) ×2 IMPLANT
SUT VICRYL 0 27 CT2 27 ABS (SUTURE) ×12 IMPLANT
SUT VICRYL 0 UR6 27IN ABS (SUTURE) ×4 IMPLANT
SUT VICRYL 4-0 PS2 18IN ABS (SUTURE) ×8 IMPLANT
SYR 30ML LL (SYRINGE) ×4 IMPLANT
SYR 50ML LL SCALE MARK (SYRINGE) ×4 IMPLANT
SYSTEM CONVERTIBLE TROCAR (TROCAR) ×4 IMPLANT
TOWEL OR 17X24 6PK STRL BLUE (TOWEL DISPOSABLE) ×8 IMPLANT
TROCAR 12M 150ML BLUNT (TROCAR) ×4 IMPLANT
TROCAR DISP BLADELESS 8 DVNC (TROCAR) ×1 IMPLANT
TROCAR DISP BLADELESS 8MM (TROCAR) ×1
TROCAR XCEL 12X100 BLDLESS (ENDOMECHANICALS) ×4 IMPLANT
TROCAR XCEL NON-BLD 5MMX100MML (ENDOMECHANICALS) ×2 IMPLANT
TUBING FILTER THERMOFLATOR (ELECTROSURGICAL) ×6 IMPLANT
WARMER LAPAROSCOPE (MISCELLANEOUS) ×4 IMPLANT
WATER STERILE IRR 1000ML POUR (IV SOLUTION) ×12 IMPLANT

## 2012-08-21 NOTE — Op Note (Signed)
Preoperative diagnosis:  Bilateral ovarian cysts, suspected endometriosis Postop diagnosis: Same, stage IV endometriosis Procedure: da Vinci robot assisted bilateral ovarian endometrioma cystectomy, lysis of adhesions, fulguration of pelvic endometriosis Anesthesia Gen. Endotracheal Surgeon: Dr. Shea Evans Assistant: Dr Seymour Bars IV fluids:  LR EBL: 50 cc Urine output: in foley, clear Complications: none Pathology: Bilateral ovarian endometrioma cyst walls and excised pelvic endometriosis implants Disposition: PACU, stable  Findings: Pelvic and abdominal endometriosis, stage IV. Bilateral ovarian endometriomas noted and ovaries adhesed to ovarian fossa and lateral uterine wall. Obliterated cul-de-sac with rectal adhesions. Right peritoneal deep window noted between right round ligament and lateral wall. Normal fallopian tubes without adhesions. Abdominal wall brown implants all over. Bowel serosa also with brown implants. Normal appendix. Normal liver. Extensive inflammatory reaction of uterine serosa, ovarian capsule. Normal ureteral peristalsis.   Procedure:  Indication: -- 31 yo, G0, dysmenorrhea, pelvic pain and pelvic CT and sonogram noted bilateral ovarian cysts, likely endometriomas.   Complications of surgery including infection, bleeding, damage to internal organs and other surgery related problems including pneumonia, VTE reviewed and informed written consent was obtained. Also reviewed need for repeat surgeries in future if indeed advance stage endometriosis. Patient understood and gave informed written consent.   Patient was brought to the operating room with IV running. She received 1gm Ancef . Underwent general anesthesia without difficulty and was given dorsal lithotomy position, prepped and draped in sterile fashion. Foley catheter was placed. Cervix was exposed with a speculum and anterior lip of the cervix was grasped with tenaculum. Uterus was sounded to 8 cm. A  Zumi  manipulator was placed, secured with balloon. Tenaculum and speculum was removed.  Attention was focused on abdomen. Supraumbilical 12 mm transverse curved incision made with scalpel after injecting Marcaine in upper edge of umbilicus. Fascia dissected, grasped with Kocher's and incised, posterior rectus sheath and peritoneum grasped, incised, intraabdominal entry confirmed. Purse string stay stitch on 0-Vicryl taken on fascia and Hassan cannula introduced and Vicryl sutures secured on the Hassan cannula. Pneumoperitoneum was begun. Laparoscope was introduced, peritoneal entry was noted to be uncomplicated. Trendelenburg position given. Above findings noted.  Port sites marked and injected with Marcaine. Two Robotic cannulas on left side, one on the right and a 5mm assistant port on right introduced under vision. Robot was docked from left in standard fashion. PK , Prograsp and scissors introduced through Robotic arms. Dr Seymour Bars stayed on right to assist and Dr. Juliene Pina scrubbed out and went for surgical console.   Pelvis and abdomen evaluated in stepwise fashion and findings noted as above.  Uterus and ovaries adhesions were first dissected and then ovaries were lifted and dissected away from ovarian fossa each. Right ovarian endometrioma was drained, followed by ovarian cystectomy, ovarian bed bleeding was cauterized with excellent hemostasis. Ovarian fossa evaluated, adhesiolysis followed by cauterization done for excellent hemostasis. Ovarian capsule inflammatory response tissue was excised. Left ovarian endometrioma was drained, followed by ovarian cystectomy, ovarian bed bleeding was cauterized with excellent hemostasis. Ovarian capsule inflammatory response tissue was excised.  Ovarian fossa endometriotic implants excised and hemostasis obtained. Right posterior to round ligament peritoneal window was opened further, endometriotic peritonea implants excised followed by hemostasis. Cul de sac was obliterated  and rectal adhesions noted, minimal dissection done and bleeding cauterized but superficial oozing was controlled by injecting Flowseal on it at the end of the case. Left lateral wall bowel adhesions excised. Right side bowel mobilized and appendix identified, noted to be normal.   Irrigation was performed. Flowseal  injected in posterior cul de sac and ovarian fossas over oozing areas. Robotic instruments were removed. Robot was de-docked. Suctioning of irrigation fluid done. Interceed placed on and around both the ovaries and under the fallopian tubes. Patient was made supine. All ports removed under vision. Pneumoperitoneum deflated and laparoscope and central port removed under vision. The facial stay sutures tied  with excellent fascial closure. Skin approximated with subcuticular stitches on 4-0 Vicryl. Dermabond was applied. Zumi and foley removed.  All instruments/lap/sponges counts were correct x2.  No complications. Patient tolerated procedure well and was reversed from anesthesia and brought to the PACU stable condition.  Dr Juliene Pina was the surgeon for entire case.

## 2012-08-21 NOTE — Transfer of Care (Signed)
Immediate Anesthesia Transfer of Care Note  Patient: Maria Fleming  Procedure(s) Performed: Procedure(s) (LRB) with comments: ROBOTIC ASSISTED LAPAROSCOPIC LYSIS OF ADHESION (N/A) ABLATION ON ENDOMETRIOSIS (N/A) OVARIAN CYSTECTOMY (Bilateral)  Patient Location: PACU  Anesthesia Type: General  Level of Consciousness: awake, alert , oriented and patient cooperative  Airway & Oxygen Therapy: Patient Spontanous Breathing and Patient connected to nasal cannula oxygen  Post-op Assessment: Report given to PACU RN, Post -op Vital signs reviewed and stable and Patient moving all extremities X 4  Post vital signs: Reviewed and stable  Complications: No apparent anesthesia complications

## 2012-08-21 NOTE — Anesthesia Postprocedure Evaluation (Signed)
  Anesthesia Post-op Note  Patient: Maria Fleming  Procedure(s) Performed: Procedure(s) (LRB) with comments: ROBOTIC ASSISTED LAPAROSCOPIC LYSIS OF ADHESION (N/A) ABLATION ON ENDOMETRIOSIS (N/A) OVARIAN CYSTECTOMY (Bilateral)  Patient Location: PACU  Anesthesia Type: General  Level of Consciousness: awake, alert  and oriented  Airway and Oxygen Therapy: Patient Spontanous Breathing  Post-op Pain: mild  Post-op Assessment: Post-op Vital signs reviewed, Patient's Cardiovascular Status Stable, Respiratory Function Stable, Patent Airway, No signs of Nausea or vomiting and Pain level controlled  Post-op Vital Signs: Reviewed and stable  Complications: No apparent anesthesia complications

## 2012-08-21 NOTE — H&P (Signed)
Maria Fleming is an 31 y.o. female. G0. Here for pelvic pain, dysmenorrhea, office sono noted bilateral ovarian cysts c/w endometriomas.  Severe dysmenorrhea. Regular menses. Depo Provera not helping.  CIN hx in past. Recent nl Pap.  Healthy, no prior abdo/pelvic surgeries.   Past Medical History  Diagnosis Date  . Gastroenteritis     has been seen by specialist, unsure of official dx  . Cervix abnormality     precancerous cells  . No pertinent past medical history     Past Surgical History  Procedure Date  . Breast lumpectomy   . Wisdom tooth extraction     Family History  Problem Relation Age of Onset  . Diabetes Mother   . Hypertension Mother     Social History:  reports that she has never smoked. She does not have any smokeless tobacco history on file. She reports that she drinks alcohol. She reports that she does not use illicit drugs.  Allergies: No Known Allergies  Prescriptions prior to admission  Medication Sig Dispense Refill  . Biotin 10 MG TABS Take 10 mg by mouth daily.      . MedroxyPROGESTERone Acetate (DEPO-PROVERA IM) Inject 1 each into the muscle every 3 (three) months.      . Multiple Vitamin (MULTIVITAMIN WITH MINERALS) TABS Take 1 tablet by mouth daily.      Marland Kitchen omeprazole (PRILOSEC) 20 MG capsule Take 20 mg by mouth daily.        Review of Systems  Constitutional: Negative for fever.  Respiratory: Negative for shortness of breath.   Cardiovascular: Negative for chest pain.  Neurological: Negative for headaches.    Blood pressure 122/64, pulse 85, temperature 98.2 F (36.8 C), temperature source Oral, resp. rate 18, SpO2 100.00%. Physical Exam A&O x 3, no acute distress. Pleasant HEENT neg, no thyromegaly Lungs CTA bilat CV RRR, S1S2 normal Abdo soft, non tender, non acute Extr no edema/ tenderness Pelvic Bilateral fixed pelvic cysts  Results for orders placed during the hospital encounter of 08/21/12 (from the past 24 hour(s))  PREGNANCY,  URINE     Status: Normal   Collection Time   08/21/12  5:59 AM      Component Value Range   Preg Test, Ur NEGATIVE  NEGATIVE    No results found. Sept'13 Office sono - Normal uterus. Bilateral complex ovarian cysts about 4-5 cm each with low level echoes, suggesting endometriosis. No free fluid.  NUU'72 - CT at [facility] ED (ED visit for abdo-pelvic pain) noted larger bilateral cysts (6-8 cm ovaries) c/w endometriosis  Assessment/Plan: Laparoscopic bilateral ovarian cyst excision with da vinci robot assistance and excision/ ablation of endometriosis.   Risks/complications of surgery reviewed incl infection, bleeding, damage to internal organs including bladder, bowels, ureters, blood vessels, other risks from anesthesia, VTE and delayed complications of any surgery, complications in future surgery reviewed. Also reviewed need for repeat surgery after conservative treatment for endometriosis.    [name] R 08/21/2012, 6:31 AM

## 2012-08-21 NOTE — Anesthesia Preprocedure Evaluation (Signed)
Anesthesia Evaluation  Patient identified by MRN, date of birth, ID band Patient awake    Reviewed: Allergy & Precautions, H&P , Patient's Chart, lab work & pertinent test results, reviewed documented beta blocker date and time   History of Anesthesia Complications Negative for: history of anesthetic complications  Airway Mallampati: II TM Distance: >3 FB Neck ROM: full    Dental No notable dental hx.    Pulmonary neg pulmonary ROS,  breath sounds clear to auscultation  Pulmonary exam normal       Cardiovascular Exercise Tolerance: Good negative cardio ROS  Rhythm:regular Rate:Normal     Neuro/Psych negative neurological ROS  negative psych ROS   GI/Hepatic negative GI ROS, Neg liver ROS,   Endo/Other  negative endocrine ROS  Renal/GU negative Renal ROS     Musculoskeletal   Abdominal   Peds  Hematology negative hematology ROS (+)   Anesthesia Other Findings Gastroenteritis   has been seen by specialist, unsure of official dx Cervix abnormality   precancerous cells    No pertinent past medical history    Reproductive/Obstetrics negative OB ROS                           Anesthesia Physical Anesthesia Plan  ASA: II  Anesthesia Plan: General ETT   Post-op Pain Management:    Induction:   Airway Management Planned:   Additional Equipment:   Intra-op Plan:   Post-operative Plan:   Informed Consent: I have reviewed the patients History and Physical, chart, labs and discussed the procedure including the risks, benefits and alternatives for the proposed anesthesia with the patient or authorized representative who has indicated his/her understanding and acceptance.   Dental Advisory Given  Plan Discussed with: CRNA and Surgeon  Anesthesia Plan Comments:         Anesthesia Quick Evaluation

## 2012-08-25 ENCOUNTER — Encounter (HOSPITAL_COMMUNITY): Payer: Self-pay | Admitting: Obstetrics & Gynecology

## 2013-01-07 ENCOUNTER — Other Ambulatory Visit: Payer: Self-pay | Admitting: Obstetrics & Gynecology

## 2013-01-07 ENCOUNTER — Other Ambulatory Visit: Payer: Self-pay | Admitting: Obstetrics and Gynecology

## 2013-01-07 DIAGNOSIS — N6313 Unspecified lump in the right breast, lower outer quadrant: Secondary | ICD-10-CM

## 2013-01-07 DIAGNOSIS — N6323 Unspecified lump in the left breast, lower outer quadrant: Secondary | ICD-10-CM

## 2013-01-19 ENCOUNTER — Ambulatory Visit
Admission: RE | Admit: 2013-01-19 | Discharge: 2013-01-19 | Disposition: A | Discharge status: Home / Self Care | Payer: 59 | Source: Ambulatory Visit | Attending: Obstetrics & Gynecology | Admitting: Obstetrics & Gynecology

## 2013-01-19 ENCOUNTER — Other Ambulatory Visit: Payer: Self-pay | Admitting: Obstetrics & Gynecology

## 2013-01-19 ENCOUNTER — Other Ambulatory Visit: Payer: 59

## 2013-01-19 DIAGNOSIS — N6313 Unspecified lump in the right breast, lower outer quadrant: Secondary | ICD-10-CM

## 2013-01-19 DIAGNOSIS — N6323 Unspecified lump in the left breast, lower outer quadrant: Secondary | ICD-10-CM

## 2013-05-12 ENCOUNTER — Other Ambulatory Visit: Payer: 59

## 2013-05-20 ENCOUNTER — Other Ambulatory Visit: Payer: 59

## 2014-04-06 ENCOUNTER — Other Ambulatory Visit (HOSPITAL_COMMUNITY): Payer: Self-pay | Admitting: Obstetrics & Gynecology

## 2014-04-06 DIAGNOSIS — IMO0002 Reserved for concepts with insufficient information to code with codable children: Secondary | ICD-10-CM

## 2014-04-06 DIAGNOSIS — N809 Endometriosis, unspecified: Secondary | ICD-10-CM

## 2014-04-08 ENCOUNTER — Ambulatory Visit (HOSPITAL_COMMUNITY): Payer: 59

## 2014-04-09 ENCOUNTER — Ambulatory Visit (HOSPITAL_COMMUNITY)
Admission: RE | Admit: 2014-04-09 | Discharge: 2014-04-09 | Disposition: A | Discharge status: Home / Self Care | Payer: 59 | Source: Ambulatory Visit | Attending: Obstetrics & Gynecology | Admitting: Obstetrics & Gynecology

## 2014-04-09 DIAGNOSIS — IMO0002 Reserved for concepts with insufficient information to code with codable children: Secondary | ICD-10-CM

## 2014-04-09 DIAGNOSIS — N979 Female infertility, unspecified: Secondary | ICD-10-CM | POA: Insufficient documentation

## 2014-04-09 DIAGNOSIS — N809 Endometriosis, unspecified: Secondary | ICD-10-CM

## 2014-04-09 MED ORDER — IOHEXOL 300 MG/ML  SOLN
20.0000 mL | Freq: Once | INTRAMUSCULAR | Status: AC | PRN
  Administered 2014-04-09: 10 mL

## 2014-11-16 ENCOUNTER — Ambulatory Visit (INDEPENDENT_AMBULATORY_CARE_PROVIDER_SITE_OTHER): Payer: BLUE CROSS/BLUE SHIELD

## 2014-11-16 ENCOUNTER — Ambulatory Visit (INDEPENDENT_AMBULATORY_CARE_PROVIDER_SITE_OTHER): Payer: BLUE CROSS/BLUE SHIELD | Admitting: Podiatry

## 2014-11-16 ENCOUNTER — Encounter: Payer: Self-pay | Admitting: Podiatry

## 2014-11-16 VITALS — BP 110/73 | HR 61 | Resp 16 | Ht 66.0 in | Wt 145.0 lb

## 2014-11-16 DIAGNOSIS — M2011 Hallux valgus (acquired), right foot: Secondary | ICD-10-CM

## 2014-11-16 DIAGNOSIS — M21629 Bunionette of unspecified foot: Secondary | ICD-10-CM

## 2014-11-16 DIAGNOSIS — M21611 Bunion of right foot: Secondary | ICD-10-CM

## 2014-11-16 DIAGNOSIS — M205X9 Other deformities of toe(s) (acquired), unspecified foot: Secondary | ICD-10-CM

## 2014-11-16 NOTE — Patient Instructions (Signed)
Bunion (Hallux Valgus) A bony bump (protrusion) on the inside of the foot, at the base of the first toe, is called a bunion (hallux valgus). A bunion causes the first toe to angle toward the other toes. SYMPTOMS   A bony bump on the inside of the foot, causing an outward turning of the first toe. It may also overlap the second toe.  Thickening of the skin (callus) over the bony bump.  Fluid buildup under the callus. Fluid may become red, tender, and swollen (inflamed) with constant irritation or pressure.  Foot pain and stiffness. CAUSES  Many causes exist, including:  Inherited from your family (genetics).  Injury (trauma) forcing the first toe into a position in which it overlaps other toes.  Bunions are also associated with wearing shoes that have a narrow toe box (pointy shoes). RISK INCREASES WITH:  Family history of foot abnormalities, especially bunions.  Arthritis.  Narrow shoes, especially high heels. PREVENTION  Wear shoes with a wide toe box.  Avoid shoes with high heels.  Wear a small pad between the big toe and second toe.  Maintain proper conditioning:  Foot and ankle flexibility.  Muscle strength and endurance. PROGNOSIS  With proper treatment, bunions can typically be cured. Occasionally, surgery is required.  RELATED COMPLICATIONS   Infection of the bunion.  Arthritis of the first toe.  Risks of surgery, including infection, bleeding, injury to nerves (numb toe), recurrent bunion, overcorrection (toe points inward), arthritis of the big toe, big toe pointing upward, and bone not healing. TREATMENT  Treatment first consists of stopping the activities that aggravate the pain, taking pain medicines, and icing to reduce inflammation and pain. Wear shoes with a wide toe box. Shoes can be modified by a shoe repair person to relieve pressure on the bunion, especially if you cannot find shoes with a wide enough toe box. You may also place a pad with the  center cut out in your shoe, to reduce pressure on the bunion. Sometimes, an arch support (orthotic) may reduce pressure on the bunion and alleviate the symptoms. Stretching and strengthening exercises for the muscles of the foot may be useful. You may choose to wear a brace or pad at night to hold the big toe away from the second toe. If non-surgical treatments are not successful, surgery may be needed. Surgery involves removing the overgrown tissue and correcting the position of the first toe, by realigning the bones. Bunion surgery is typically performed on an outpatient basis, meaning you can go home the same day as surgery. The surgery may involve cutting the mid portion of the bone of the first toe, or just cutting and repairing (reconstructing) the ligaments and soft tissues around the first toe.  MEDICATION   If pain medicine is needed, nonsteroidal anti-inflammatory medicines, such as aspirin and ibuprofen, or other minor pain relievers, such as acetaminophen, are often recommended.  Do not take pain medicine for 7 days before surgery.  Prescription pain relievers are usually only prescribed after surgery. Use only as directed and only as much as you need.  Ointments applied to the skin may be helpful. HEAT AND COLD  Cold treatment (icing) relieves pain and reduces inflammation. Cold treatment should be applied for 10 to 15 minutes every 2 to 3 hours for inflammation and pain and immediately after any activity that aggravates your symptoms. Use ice packs or an ice massage.  Heat treatment may be used prior to performing the stretching and strengthening activities prescribed by your   caregiver, physical therapist, or athletic trainer. Use a heat pack or a warm soak. SEEK MEDICAL CARE IF:   Symptoms get worse or do not improve in 2 weeks, despite treatment.  After surgery, you develop fever, increasing pain, redness, swelling, drainage of fluids, bleeding, or increasing warmth around the  surgical area.  New, unexplained symptoms develop. (Drugs used in treatment may produce side effects.) Document Released: 10/15/2005 Document Revised: 01/07/2012 Document Reviewed: 01/27/2009 [facility] Patient Information 2015 [employer] [place]. This information is not intended to replace advice given to you by your health care provider. Make sure you discuss any questions you have with your health care provider.  Bunion Care A bunion is a boney protrusion at the base of your big toe (metatarsal-phalangeal joint). This problem, if painful or troublesome can be corrected with surgery. This is an elective surgery, so you can pick a convenient time for the procedure. The surgery may:  Improve appearance (cosmetic).  Relieve pain.  Improve function. Your foot is made up of a complex set of twenty-six bones which are held together by tough fibrous ligaments. The movement of the foot is controlled by muscles in the foot and leg. These muscles attach to the foot by cord like structures (tendons) that attach muscle to bone. If surgery is recommended, your caregiver will explain your foot problem and how surgery can improve it. Your caregiver can answer questions you may have about the potential risks and complications involved. After determining that foot surgery is necessary to correct your problem, you can proceed with plans for the surgery. LET YOUR CAREGIVER KNOW ABOUT:   Previous problems with anesthetics or medicines used to numb the skin.  Allergies to dyes, iodine, foods, and/or latex.  Medicines taken including herbs, eye drops, prescription medicines (especially medicines used to "thin the blood"), aspirin and other over-the-counter medicines, and steroids (by mouth or as a cream).  History of bleeding or blood problems.  Possibility of pregnancy, if this applies.  History of blood clots in your legs and/or lungs.  Previous surgery.  Other important health problems. Let your caregiver  know about health changes prior to surgery. BEFORE THE PROCEDURE  You should be present 60 minutes prior to your procedure or as directed.  PROCEDURE BUNION TYPES AND THEIR TREATMENTS  Positional Bunion. A positional bunion develops when a bony growth on the side of the metatarsal bone enlarges the joint. The metatarsal forces the joint capsule to stretch over it. As this growth pushes the big toe toward the others, the tendons on the inside tighten. This forces the big toe farther out of alignment. The bunion presses against the shoe, irritating the skin and causes further pain and disability.  Positional Bunionectomy Treatment. The bunion is removed. Tight tendons may be released.  Follow-up Care. Your toe is apt to be stiff at first but will loosen up as you move it. You may need to wear a special surgical shoe and, possibly, a splint for about three weeks.  Mild Structural Bunion. Structural bunions occur when the angle between the first and second metatarsal bones increases to a point where it is greater than normal. The increased angle of the metatarsals makes the big toe slant toward the other toes. Sometimes bony growths may form. Irritation and swelling often follow.  Structural Bunionectomy Treatment. Your caregiver surgically repositions the bone by decreasing the angle and may use a fixation device to hold it together. The bunion (bump) is also removed.  Degenerative Joint Disease (Arthritis). When  wear-and-tear arthritis (osteoarthritis) of aging affects the big toe joint, pain and reduced joint motion may result. This is not a true bunion but may be associated with bunions. Left untreated, it can increase wear and tear in the joint and break down the cartilage. Pain and stiffness are problems of both wear-and-tear arthritis and rheumatoid arthritis.  Arthroplasty With Joint Implantation As Treatment. The bunion is first removed; then the degenerated joint is removed and replaced with  an implant. AFTER THE PROCEDURE  After surgery your bone will heal in phases. A callous (new bone formation) forms, bridging the damaged bone allowing it to heal. In about 6 months, the bone is back to normal strength along with a return of nearly normal function. It is best to do elective surgeries when your health is optimal.  After surgery, you will be taken to the recovery area where a nurse will watch and check your progress. Once you are awake, stable, and taking fluids well, barring other problems you will be allowed to go home. HOME CARE INSTRUCTIONS  Be sure to ask your caregiver how long you will be off your feet and home from work. Plan accordingly. There are several types of bunions and varying surgical treatments for each. Common types are explained above. Your surgery may be similar and may include a fixation device (such as a small screw). Your foot and ankle may be immobilized by a cast (from your toes to below your knee). You may be asked not to bear weight on this foot for a few weeks or until comfortable. Once home, an ice pack applied to your operative site may help with discomfort and keep the swelling down. You may be able to walk a day or two after surgery. Your podiatrist may prescribe a splint or a special shoe to be worn for several weeks. Only take over-the-counter or prescription medicines for pain, discomfort, or fever as directed by your caregiver.  SEEK MEDICAL CARE IF:   There is increased bleeding (more than a small spot) from the surgical site.  You notice redness, swelling, or increasing pain in the surgical site.  Pus is coming from the site.  An unexplained oral temperature above 102 F (38.9 C) develops.  You notice a foul smell coming from the surgical site or dressing. SEEK IMMEDIATE MEDICAL CARE IF:  You develop a rash, have difficulty breathing, or have any allergic problems with medications. Document Released: 10/12/2000 Document Revised: 01/07/2012  Document Reviewed: 10/18/2008 [facility] Patient Information 2015 [place], [place]. This information is not intended to replace advice given to you by your health care provider. Make sure you discuss any questions you have with your health care provider.

## 2014-11-16 NOTE — Progress Notes (Signed)
   Subjective:    Patient ID: Maria Fleming, female    DOB: 08/06/1981, 34 y.o.   MRN: [id number]  HPI Comments: 34 year old female presents the office today with complaints of bunions to both of her feet. She states that she has had bunions her entire life but she feels as they become worse as she is gotten older. She states that they occasionally hurt with the left greater than the right. Over the past year she has started his blisters form on the medial aspects of the second toes with a right greater than left from where the bunion is pushing on the second toe. She is attended toe separators, corn removal pads without any resolution. She states it is difficult to by a proper shoe given how wide her foot is in the forefoot and narrowing the heel. No other complaints at this time..mw     Review of Systems  All other systems reviewed and are negative.      Objective:   Physical Exam AAO x3, NAD DP/PT pulses palpable bilaterally, CRT less than 3 seconds Protective sensation intact with Simms Weinstein monofilament, vibratory sensation intact, Achilles tendon reflex intact Moderate structural HAV deformity present bilaterally with a prominent medial eminence of the first metatarsal head and lateral deviation of the hallux. There is no pain or crepitation with first MTPJ range of motion. No significant hypermobility is noted bilaterally. There is hyperkeratotic lesions bilateral medial second digit somewhat the hallux is abutting the digit. Tailors bunion deformity is present bilaterally, which she states does not hurt. Decrease in medial arch height upon weightbearing. MMT 5/5, ROM WNL No open lesions or pre-ulcerative lesions are identified. No pain with calf compression, swelling, warmth, erythema.     Assessment & Plan:  34 year old female with bilateral HAV deformity  -X-rays were obtained and reviewed with the patient.  -Treatment options both conservative and surgical were discussed the  patient including alternatives, risks, complications. -At this time she elects to proceed with conservative treatment over the next couple of months. Discussed with her toe separators, offloading pads, shoe gear modifications, orthotics. She states that she would like to undergo surgical correction later on this year if areas remain symptomatic. Discussed with her to continue these conservative treatments and she desires surgery to call the office. -At today's appointment discussed surgical intervention which would most likely consist of Austin/Akin osteotomy with wire/screw fixation. Incision placement of postoperative course as well as the surgery was discussed with the patient. -Follow-up as needed. In the meantime, course and call the office with any questions, concerns, changes symptoms.

## 2014-11-30 ENCOUNTER — Ambulatory Visit: Payer: Self-pay | Admitting: Podiatry

## 2014-12-28 ENCOUNTER — Ambulatory Visit (INDEPENDENT_AMBULATORY_CARE_PROVIDER_SITE_OTHER): Payer: BLUE CROSS/BLUE SHIELD | Admitting: Podiatry

## 2014-12-28 ENCOUNTER — Encounter: Payer: Self-pay | Admitting: Podiatry

## 2014-12-28 VITALS — BP 121/66 | HR 73

## 2014-12-28 DIAGNOSIS — M2041 Other hammer toe(s) (acquired), right foot: Secondary | ICD-10-CM | POA: Diagnosis not present

## 2014-12-28 DIAGNOSIS — M2011 Hallux valgus (acquired), right foot: Secondary | ICD-10-CM | POA: Diagnosis not present

## 2014-12-28 DIAGNOSIS — M21611 Bunion of right foot: Secondary | ICD-10-CM

## 2014-12-28 NOTE — Patient Instructions (Signed)
Pre-Operative Instructions  Congratulations, you have decided to take an important step to improving your quality of life.  You can be assured that the doctors of [facility] will be with you every step of the way.  1. Plan to be at the surgery center/hospital at least 1 (one) hour prior to your scheduled time unless otherwise directed by the surgical center/hospital staff.  You must have a responsible adult accompany you, remain during the surgery and drive you home.  Make sure you have directions to the surgical center/hospital and know how to get there on time. 2. For hospital based surgery you will need to obtain a history and physical form from your family physician within 1 month prior to the date of surgery- we will give you a form for you primary physician.  3. We make every effort to accommodate the date you request for surgery.  There are however, times where surgery dates or times have to be moved.  We will contact you as soon as possible if a change in schedule is required.   4. No Aspirin/Ibuprofen for one week before surgery.  If you are on aspirin, any non-steroidal anti-inflammatory medications (Mobic, Aleve, Ibuprofen) you should stop taking it 7 days prior to your surgery.  You make take Tylenol  For pain prior to surgery.  5. Medications- If you are taking daily heart and blood pressure medications, seizure, reflux, allergy, asthma, anxiety, pain or diabetes medications, make sure the surgery center/hospital is aware before the day of surgery so they may notify you which medications to take or avoid the day of surgery. 6. No food or drink after midnight the night before surgery unless directed otherwise by surgical center/hospital staff. 7. No alcoholic beverages 24 hours prior to surgery.  No smoking 24 hours prior to or 24 hours after surgery. 8. Wear loose pants or shorts- loose enough to fit over bandages, boots, and casts. 9. No slip on shoes, sneakers are best. 10. Bring  your boot with you to the surgery center/hospital.  Also bring crutches or a walker if your physician has prescribed it for you.  If you do not have this equipment, it will be provided for you after surgery. 11. If you have not been contracted by the surgery center/hospital by the day before your surgery, call to confirm the date and time of your surgery. 12. Leave-time from work may vary depending on the type of surgery you have.  Appropriate arrangements should be made prior to surgery with your employer. 13. Prescriptions will be provided immediately following surgery by your doctor.  Have these filled as soon as possible after surgery and take the medication as directed. 14. Remove nail polish on the operative foot. 15. Wash the night before surgery.  The night before surgery wash the foot and leg well with the antibacterial soap provided and water paying special attention to beneath the toenails and in between the toes.  Rinse thoroughly with water and dry well with a towel.  Perform this wash unless told not to do so by your physician.  Enclosed: 1 Ice pack (please put in freezer the night before surgery)   1 Hibiclens skin cleaner   Pre-op Instructions  If you have any questions regarding the instructions, do not hesitate to call our office.  [place]: [address] [place], [place] [address] [phone]  [place]: [address] [place], [place] [address] [phone]  [place]: [address]  [place], [place] [address] [phone]  Dr. Richard   Tuchman DPM, Dr. Norman Regal DPM Dr. Richard Sikora DPM, Dr. M. Todd Hyatt DPM, Dr. Kathryn Egerton DPM, Dr. Donn Zanetti DPM 

## 2014-12-29 NOTE — Progress Notes (Signed)
Patient ID: Maria Fleming, female   DOB: 05/30/1981, 34 y.o.   MRN: [id number]  Subjective: 34 year old female presents the office today with complaints of bilateral bunion deformity. This time she'll schedule surgery to have her right bunion fixed. She states that she has continue with conservative treatment which include shoe gear modifications and offloading without any resolution of symptoms. She states that she continues to have pain overlying the bunion particularly with pressure in shoe. She states that she gets redness overlying the area particularly with certain shoe gear. No other complaints at this time.  Objective: AAO 3, NAD DP/PT pulses palpable, CRT less than 3 seconds Protective sensation intact with Simms Weinstein monofilament, vibratory sensation intact, Achilles tendon reflex intact. Moderate structural HAV deformity present bilaterally have the right side is more symptomatic than left. There is a prominent medial eminence of the first metatarsal head with lateral deviation of the hallux. There is no pain or crepitation with first MTPJ range of motion. There is no hypermobility present. On the medial aspect of the right second digit along the PIPJ there is a hyperkeratotic lesion with tenderness overlying this area. There is slight flexion contracture the digit. No other areas of tenderness to bilateral lower extremities. No overlying edema, erythema, increase in warmth. MMT 5/5, ROM WNL No pain with calf compression, swelling, warmth, erythema. No open lesions or pre-ulcerative lesions identified bilaterally otherwise.   Assessment : 34 year old female presents for symptomatic right foot bunion deformity, hyperkeratotic lesion second toe.   Plan : -Previous x-rays were discussed with the patient. On x-ray evaluation on her right side. I am Engles 12, hallux abductus angle 30, hallux abductus interphalangeus 30, sesamoid position 6, Engles angle 22.  -At this time I discussed  all alternatives, risks, complications with the patient. This time she'll to proceed with surgical treatment. I discussed the patient the proposed surgery which would include an Austin bunionectomy with possible Akin, PIPJ arthroplasty second digit right foot. Incision placement as well as postoperative course was also discussed the patient. I discussed risks of the surgery with the patient which included, but not limited to, infection, bleeding, swelling, pain, need for further surgery, delayed or nonhealing, painful or ugly scar, numbness or sensation changes, over or under correction, reoccurrence, transfer lesions, loss of toe/foot, DVT/PE, hardware failure. Patient understands risks of surgery and wishes to proceed. The surgical consent was reviewed with the patient all 3 pages were signed. No promises or guarantees were given as they are, the procedure and all questions were answered to the best of my ability.  -[facility], 2015 -Follow-up of the surgery or sooner if any problems are to arise or there is any further questions. In the meantime encouraged to call the office with any questions, concerns, change in symptoms.

## 2015-02-08 ENCOUNTER — Other Ambulatory Visit: Payer: Self-pay | Admitting: Obstetrics & Gynecology

## 2015-02-17 NOTE — Patient Instructions (Signed)
Your procedure is scheduled on:  Thursday, February 24, 2015  Enter through the Main Entrance of [facility] at: 6:00 a.m.  Pick up the phone at the desk and dial [phone].  Call this number if you have problems the morning of surgery: [phone].  Remember: Do NOT eat food or drink after: Midnight Wednesday Take these medicines the morning of surgery with a SIP OF WATER:  None  Do NOT wear jewelry (body piercing), metal hair clips/bobby pins, make-up, or nail polish. Do NOT wear lotions, powders, or perfumes.  You may wear deoderant. Do NOT shave for 48 hours prior to surgery. Do NOT bring valuables to the hospital. Contacts, dentures, or bridgework may not be worn into surgery. Have a responsible adult drive you home and stay with you for 24 hours after your procedure

## 2015-02-18 ENCOUNTER — Encounter (HOSPITAL_COMMUNITY)
Admission: RE | Admit: 2015-02-18 | Discharge: 2015-02-18 | Disposition: A | Discharge status: Home / Self Care | Payer: BLUE CROSS/BLUE SHIELD | Source: Ambulatory Visit | Attending: Obstetrics & Gynecology | Admitting: Obstetrics & Gynecology

## 2015-02-18 ENCOUNTER — Encounter (HOSPITAL_COMMUNITY): Payer: Self-pay

## 2015-02-18 DIAGNOSIS — Z01818 Encounter for other preprocedural examination: Secondary | ICD-10-CM | POA: Insufficient documentation

## 2015-02-18 LAB — CBC
HEMATOCRIT: 36.2 % (ref 36.0–46.0)
Hemoglobin: 12.1 g/dL (ref 12.0–15.0)
MCH: 30.2 pg (ref 26.0–34.0)
MCHC: 33.4 g/dL (ref 30.0–36.0)
MCV: 90.3 fL (ref 78.0–100.0)
Platelets: 271 10*3/uL (ref 150–400)
RBC: 4.01 MIL/uL (ref 3.87–5.11)
RDW: 12.8 % (ref 11.5–15.5)
WBC: 4.6 10*3/uL (ref 4.0–10.5)

## 2015-02-23 NOTE — Anesthesia Preprocedure Evaluation (Signed)
Anesthesia Evaluation  Patient identified by MRN, date of birth, ID band Patient awake    Reviewed: Allergy & Precautions, NPO status , Patient's Chart, lab work & pertinent test results  History of Anesthesia Complications Negative for: history of anesthetic complications  Airway Mallampati: II  TM Distance: >3 FB Neck ROM: Full    Dental no notable dental hx. (+) Dental Advisory Given   Pulmonary neg pulmonary ROS,  breath sounds clear to auscultation  Pulmonary exam normal       Cardiovascular negative cardio ROS  Rhythm:Regular Rate:Normal     Neuro/Psych negative neurological ROS  negative psych ROS   GI/Hepatic Neg liver ROS, Recurrent gastroenteritis   Endo/Other  negative endocrine ROS  Renal/GU negative Renal ROS  negative genitourinary   Musculoskeletal negative musculoskeletal ROS (+)   Abdominal   Peds negative pediatric ROS (+)  Hematology negative hematology ROS (+)   Anesthesia Other Findings   Reproductive/Obstetrics negative OB ROS                             Anesthesia Physical Anesthesia Plan  ASA: II  Anesthesia Plan: General   Post-op Pain Management:    Induction: Intravenous  Airway Management Planned: Oral ETT  Additional Equipment:   Intra-op Plan:   Post-operative Plan: Extubation in OR  Informed Consent: I have reviewed the patients History and Physical, chart, labs and discussed the procedure including the risks, benefits and alternatives for the proposed anesthesia with the patient or authorized representative who has indicated his/her understanding and acceptance.   Dental advisory given  Plan Discussed with: CRNA  Anesthesia Plan Comments:         Anesthesia Quick Evaluation

## 2015-02-24 ENCOUNTER — Encounter (HOSPITAL_COMMUNITY)
Admission: RE | Disposition: A | Discharge status: Home / Self Care | Payer: Self-pay | Source: Ambulatory Visit | Attending: Obstetrics & Gynecology

## 2015-02-24 ENCOUNTER — Ambulatory Visit (HOSPITAL_COMMUNITY): Payer: BLUE CROSS/BLUE SHIELD | Admitting: Anesthesiology

## 2015-02-24 ENCOUNTER — Ambulatory Visit (HOSPITAL_COMMUNITY)
Admission: RE | Admit: 2015-02-24 | Discharge: 2015-02-24 | Disposition: A | Discharge status: Home / Self Care | Payer: BLUE CROSS/BLUE SHIELD | Source: Ambulatory Visit | Attending: Obstetrics & Gynecology | Admitting: Obstetrics & Gynecology

## 2015-02-24 ENCOUNTER — Encounter (HOSPITAL_COMMUNITY): Payer: Self-pay | Admitting: *Deleted

## 2015-02-24 DIAGNOSIS — N736 Female pelvic peritoneal adhesions (postinfective): Secondary | ICD-10-CM | POA: Diagnosis not present

## 2015-02-24 DIAGNOSIS — N800 Endometriosis of uterus: Secondary | ICD-10-CM | POA: Diagnosis not present

## 2015-02-24 DIAGNOSIS — N803 Endometriosis of pelvic peritoneum: Secondary | ICD-10-CM | POA: Insufficient documentation

## 2015-02-24 DIAGNOSIS — N801 Endometriosis of ovary: Secondary | ICD-10-CM | POA: Insufficient documentation

## 2015-02-24 DIAGNOSIS — IMO0002 Reserved for concepts with insufficient information to code with codable children: Secondary | ICD-10-CM | POA: Diagnosis present

## 2015-02-24 HISTORY — PX: ROBOTIC ASSISTED LAPAROSCOPIC LYSIS OF ADHESION: SHX6080

## 2015-02-24 HISTORY — PX: CYSTECTOMY W/ URETEROSIGMOIDOSTOMY: SUR362

## 2015-02-24 LAB — PREGNANCY, URINE: Preg Test, Ur: NEGATIVE

## 2015-02-24 SURGERY — ROBOTIC ASSISTED LAPAROSCOPIC LYSIS OF ADHESION
Anesthesia: General | Site: Abdomen

## 2015-02-24 MED ORDER — LACTATED RINGERS IR SOLN
Status: DC | PRN
  Administered 2015-02-24 (×2): 3000 mL

## 2015-02-24 MED ORDER — ONDANSETRON HCL 4 MG/2ML IJ SOLN: 4.0000 mg | Freq: Once | INTRAMUSCULAR | Status: DC | PRN

## 2015-02-24 MED ORDER — PROPOFOL 10 MG/ML IV BOLUS
INTRAVENOUS | Status: AC
  Filled 2015-02-24: qty 20

## 2015-02-24 MED ORDER — METHYLENE BLUE 1 % INJ SOLN
INTRAMUSCULAR | Status: DC | PRN
  Administered 2015-02-24: 1 mL

## 2015-02-24 MED ORDER — GLYCOPYRROLATE 0.2 MG/ML IJ SOLN
INTRAMUSCULAR | Status: DC | PRN
  Administered 2015-02-24: 1 mg via INTRAVENOUS

## 2015-02-24 MED ORDER — CEFAZOLIN SODIUM-DEXTROSE 2-3 GM-% IV SOLR
INTRAVENOUS | Status: AC
  Filled 2015-02-24: qty 50

## 2015-02-24 MED ORDER — KETOROLAC TROMETHAMINE 30 MG/ML IJ SOLN
INTRAMUSCULAR | Status: AC
  Filled 2015-02-24: qty 1

## 2015-02-24 MED ORDER — PROPOFOL 10 MG/ML IV BOLUS
INTRAVENOUS | Status: DC | PRN
  Administered 2015-02-24: 200 mg via INTRAVENOUS

## 2015-02-24 MED ORDER — ROCURONIUM BROMIDE 100 MG/10ML IV SOLN
INTRAVENOUS | Status: DC | PRN
  Administered 2015-02-24: 10 mg via INTRAVENOUS
  Administered 2015-02-24: 40 mg via INTRAVENOUS

## 2015-02-24 MED ORDER — ROPIVACAINE HCL 5 MG/ML IJ SOLN
INTRAMUSCULAR | Status: AC
  Filled 2015-02-24: qty 60

## 2015-02-24 MED ORDER — KETOROLAC TROMETHAMINE 30 MG/ML IJ SOLN
30.0000 mg | Freq: Once | INTRAMUSCULAR | Status: AC
  Administered 2015-02-24: 30 mg via INTRAVENOUS

## 2015-02-24 MED ORDER — ONDANSETRON HCL 4 MG/2ML IJ SOLN
INTRAMUSCULAR | Status: DC | PRN
  Administered 2015-02-24: 4 mg via INTRAVENOUS

## 2015-02-24 MED ORDER — NEOSTIGMINE METHYLSULFATE 10 MG/10ML IV SOLN
INTRAVENOUS | Status: AC
  Filled 2015-02-24: qty 1

## 2015-02-24 MED ORDER — FENTANYL CITRATE (PF) 100 MCG/2ML IJ SOLN
INTRAMUSCULAR | Status: DC | PRN
  Administered 2015-02-24: 100 ug via INTRAVENOUS
  Administered 2015-02-24 (×6): 50 ug via INTRAVENOUS

## 2015-02-24 MED ORDER — LACTATED RINGERS IV SOLN
INTRAVENOUS | Status: DC
  Administered 2015-02-24 (×2): via INTRAVENOUS

## 2015-02-24 MED ORDER — SODIUM CHLORIDE 0.9 % IJ SOLN
INTRAMUSCULAR | Status: AC
  Filled 2015-02-24: qty 10

## 2015-02-24 MED ORDER — NEOSTIGMINE METHYLSULFATE 10 MG/10ML IV SOLN
INTRAVENOUS | Status: DC | PRN
  Administered 2015-02-24: 5 mg via INTRAVENOUS

## 2015-02-24 MED ORDER — FENTANYL CITRATE (PF) 100 MCG/2ML IJ SOLN
INTRAMUSCULAR | Status: AC
  Administered 2015-02-24: 25 ug via INTRAVENOUS
  Filled 2015-02-24: qty 2

## 2015-02-24 MED ORDER — FENTANYL CITRATE (PF) 250 MCG/5ML IJ SOLN
INTRAMUSCULAR | Status: AC
  Filled 2015-02-24: qty 5

## 2015-02-24 MED ORDER — DEXAMETHASONE SODIUM PHOSPHATE 10 MG/ML IJ SOLN
INTRAMUSCULAR | Status: DC | PRN
  Administered 2015-02-24: 4 mg via INTRAVENOUS

## 2015-02-24 MED ORDER — GLYCOPYRROLATE 0.2 MG/ML IJ SOLN
INTRAMUSCULAR | Status: AC
  Filled 2015-02-24: qty 4

## 2015-02-24 MED ORDER — SCOPOLAMINE 1 MG/3DAYS TD PT72
1.0000 | MEDICATED_PATCH | Freq: Once | TRANSDERMAL | Status: DC
  Administered 2015-02-24: 1.5 mg via TRANSDERMAL

## 2015-02-24 MED ORDER — GLYCOPYRROLATE 0.2 MG/ML IJ SOLN
INTRAMUSCULAR | Status: AC
  Filled 2015-02-24: qty 1

## 2015-02-24 MED ORDER — CEFAZOLIN SODIUM-DEXTROSE 2-3 GM-% IV SOLR
2.0000 g | INTRAVENOUS | Status: AC
  Administered 2015-02-24: 2 g via INTRAVENOUS

## 2015-02-24 MED ORDER — ONDANSETRON HCL 4 MG/2ML IJ SOLN
INTRAMUSCULAR | Status: AC
  Filled 2015-02-24: qty 2

## 2015-02-24 MED ORDER — DEXAMETHASONE SODIUM PHOSPHATE 4 MG/ML IJ SOLN
INTRAMUSCULAR | Status: AC
  Filled 2015-02-24: qty 1

## 2015-02-24 MED ORDER — SCOPOLAMINE 1 MG/3DAYS TD PT72
MEDICATED_PATCH | TRANSDERMAL | Status: AC
  Administered 2015-02-24: 1.5 mg via TRANSDERMAL
  Filled 2015-02-24: qty 1

## 2015-02-24 MED ORDER — FENTANYL CITRATE (PF) 100 MCG/2ML IJ SOLN
INTRAMUSCULAR | Status: AC
  Filled 2015-02-24: qty 2

## 2015-02-24 MED ORDER — METHYLENE BLUE 1 % INJ SOLN
INTRAMUSCULAR | Status: AC
  Filled 2015-02-24: qty 1

## 2015-02-24 MED ORDER — LIDOCAINE HCL (CARDIAC) 20 MG/ML IV SOLN
INTRAVENOUS | Status: AC
  Filled 2015-02-24: qty 5

## 2015-02-24 MED ORDER — FENTANYL CITRATE (PF) 100 MCG/2ML IJ SOLN
25.0000 ug | INTRAMUSCULAR | Status: DC | PRN
  Administered 2015-02-24 (×4): 25 ug via INTRAVENOUS

## 2015-02-24 MED ORDER — ARTIFICIAL TEARS OP OINT
TOPICAL_OINTMENT | OPHTHALMIC | Status: AC
  Filled 2015-02-24: qty 3.5

## 2015-02-24 MED ORDER — SODIUM CHLORIDE 0.9 % IJ SOLN
INTRAMUSCULAR | Status: AC
  Filled 2015-02-24: qty 50

## 2015-02-24 MED ORDER — MIDAZOLAM HCL 2 MG/2ML IJ SOLN
INTRAMUSCULAR | Status: DC | PRN
  Administered 2015-02-24: 2 mg via INTRAVENOUS

## 2015-02-24 MED ORDER — IBUPROFEN 200 MG PO TABS: 600.0000 mg | ORAL_TABLET | Freq: Four times a day (QID) | ORAL | Status: DC | PRN

## 2015-02-24 MED ORDER — OXYCODONE-ACETAMINOPHEN 5-325 MG PO TABS: 2.0000 | ORAL_TABLET | Freq: Four times a day (QID) | ORAL | Status: DC | PRN

## 2015-02-24 MED ORDER — MIDAZOLAM HCL 2 MG/2ML IJ SOLN
INTRAMUSCULAR | Status: AC
  Filled 2015-02-24: qty 2

## 2015-02-24 MED ORDER — ROPIVACAINE HCL 5 MG/ML IJ SOLN
INTRAMUSCULAR | Status: DC | PRN
  Administered 2015-02-24: 76 mL

## 2015-02-24 MED ORDER — LIDOCAINE HCL (CARDIAC) 20 MG/ML IV SOLN
INTRAVENOUS | Status: DC | PRN
  Administered 2015-02-24: 50 mg via INTRAVENOUS

## 2015-02-24 MED ORDER — ROCURONIUM BROMIDE 100 MG/10ML IV SOLN
INTRAVENOUS | Status: AC
  Filled 2015-02-24: qty 1

## 2015-02-24 MED ORDER — GLYCOPYRROLATE 0.2 MG/ML IJ SOLN
INTRAMUSCULAR | Status: AC
  Filled 2015-02-24: qty 5

## 2015-02-24 SURGICAL SUPPLY — 52 items
BARRIER ADHS 3X4 INTERCEED (GAUZE/BANDAGES/DRESSINGS) IMPLANT
BRR ADH 4X3 ABS CNTRL BYND (GAUZE/BANDAGES/DRESSINGS)
CATH FOLEY 3WAY  5CC 16FR (CATHETERS) ×1
CATH FOLEY 3WAY 5CC 16FR (CATHETERS) ×1 IMPLANT
CHLORAPREP W/TINT 26ML (MISCELLANEOUS) ×2 IMPLANT
CLOTH BEACON ORANGE TIMEOUT ST (SAFETY) ×2 IMPLANT
CONT PATH 16OZ SNAP LID 3702 (MISCELLANEOUS) ×2 IMPLANT
COVER BACK TABLE 60X90IN (DRAPES) ×4 IMPLANT
COVER TIP SHEARS 8 DVNC (MISCELLANEOUS) ×1 IMPLANT
COVER TIP SHEARS 8MM DA VINCI (MISCELLANEOUS) ×1
DECANTER SPIKE VIAL GLASS SM (MISCELLANEOUS) ×2 IMPLANT
DRAPE WARM FLUID 44X44 (DRAPE) ×2 IMPLANT
DRSG COVADERM PLUS 2X2 (GAUZE/BANDAGES/DRESSINGS) ×8 IMPLANT
DRSG OPSITE POSTOP 3X4 (GAUZE/BANDAGES/DRESSINGS) ×2 IMPLANT
ELECT REM PT RETURN 9FT ADLT (ELECTROSURGICAL) ×2
ELECTRODE REM PT RTRN 9FT ADLT (ELECTROSURGICAL) ×1 IMPLANT
GAUZE VASELINE 3X9 (GAUZE/BANDAGES/DRESSINGS) IMPLANT
GLOVE BIO SURGEON STRL SZ7 (GLOVE) ×4 IMPLANT
GLOVE BIOGEL PI IND STRL 7.0 (GLOVE) ×2 IMPLANT
GLOVE BIOGEL PI INDICATOR 7.0 (GLOVE) ×2
GLOVE ECLIPSE 6.5 STRL STRAW (GLOVE) ×6 IMPLANT
KIT ACCESSORY DA VINCI DISP (KITS) ×1
KIT ACCESSORY DVNC DISP (KITS) ×1 IMPLANT
LEGGING LITHOTOMY PAIR STRL (DRAPES) ×2 IMPLANT
LIQUID BAND (GAUZE/BANDAGES/DRESSINGS) ×2 IMPLANT
MANIPULATOR UTERINE 4.5 ZUMI (MISCELLANEOUS) ×1 IMPLANT
OCCLUDER COLPOPNEUMO (BALLOONS) ×2 IMPLANT
PACK ROBOT WH (CUSTOM PROCEDURE TRAY) ×2 IMPLANT
PACK ROBOTIC GOWN (GOWN DISPOSABLE) ×2 IMPLANT
PAD POSITIONER PINK NONSTERILE (MISCELLANEOUS) ×2 IMPLANT
PAD PREP 24X48 CUFFED NSTRL (MISCELLANEOUS) ×4 IMPLANT
SET CYSTO W/LG BORE CLAMP LF (SET/KITS/TRAYS/PACK) IMPLANT
SET IRRIG TUBING LAPAROSCOPIC (IRRIGATION / IRRIGATOR) ×2 IMPLANT
SET TRI-LUMEN FLTR TB AIRSEAL (TUBING) ×1 IMPLANT
SUT VICRYL 0 27 CT2 27 ABS (SUTURE) ×6 IMPLANT
SUT VICRYL 0 UR6 27IN ABS (SUTURE) ×2 IMPLANT
SUT VICRYL 4-0 PS2 18IN ABS (SUTURE) ×4 IMPLANT
SYR 30ML LL (SYRINGE) ×2 IMPLANT
SYR 50ML LL SCALE MARK (SYRINGE) ×2 IMPLANT
SYSTEM CONVERTIBLE TROCAR (TROCAR) ×2 IMPLANT
TIP UTERINE 5.1X6CM LAV DISP (MISCELLANEOUS) IMPLANT
TIP UTERINE 6.7X10CM GRN DISP (MISCELLANEOUS) IMPLANT
TIP UTERINE 6.7X6CM WHT DISP (MISCELLANEOUS) IMPLANT
TIP UTERINE 6.7X8CM BLUE DISP (MISCELLANEOUS) IMPLANT
TOWEL OR 17X24 6PK STRL BLUE (TOWEL DISPOSABLE) ×4 IMPLANT
TROCAR 12M 150ML BLUNT (TROCAR) ×2 IMPLANT
TROCAR DISP BLADELESS 8 DVNC (TROCAR) IMPLANT
TROCAR DISP BLADELESS 8MM (TROCAR)
TROCAR PORT AIRSEAL 5X120 (TROCAR) ×1 IMPLANT
TROCAR XCEL 12X100 BLDLESS (ENDOMECHANICALS) ×2 IMPLANT
WARMER LAPAROSCOPE (MISCELLANEOUS) ×2 IMPLANT
WATER STERILE IRR 1000ML POUR (IV SOLUTION) ×6 IMPLANT

## 2015-02-24 NOTE — Discharge Instructions (Signed)

## 2015-02-24 NOTE — Anesthesia Procedure Notes (Signed)
Procedure Name: Intubation Date/Time: 02/24/2015 7:33 AM Performed by: [name], VAISHALI Pre-anesthesia Checklist: Patient identified, Emergency Drugs available, Suction available and Patient being monitored Patient Re-evaluated:Patient Re-evaluated prior to inductionOxygen Delivery Method: Circle system utilized Preoxygenation: Pre-oxygenation with 100% oxygen Intubation Type: IV induction Ventilation: Mask ventilation without difficulty Laryngoscope Size: Miller and 2 Grade View: Grade I Tube type: Oral Tube size: 7.0 mm Number of attempts: 1 Airway Equipment and Method: Stylet Placement Confirmation: ETT inserted through vocal cords under direct vision,  positive ETCO2 and breath sounds checked- equal and bilateral Secured at: 22 cm Tube secured with: Tape Dental Injury: Teeth and Oropharynx as per pre-operative assessment

## 2015-02-24 NOTE — Anesthesia Postprocedure Evaluation (Signed)
  Anesthesia Post-op Note  Patient: Maria Fleming  Procedure(s) Performed: Procedure(s) with comments: ROBOTIC ASSISTED LAPAROSCOPIC LYSIS OF ADHESION/Ablation of Endometriosis; bilateral endometrioma cyst excision (N/A) - 3 1/2 hrs.  Patient Location: PACU  Anesthesia Type:General  Level of Consciousness: awake, alert  and oriented  Airway and Oxygen Therapy: Patient Spontanous Breathing  Post-op Pain: mild  Post-op Assessment: Post-op Vital signs reviewed, Patient's Cardiovascular Status Stable, Respiratory Function Stable, Patent Airway, No signs of Nausea or vomiting and Pain level controlled  Post-op Vital Signs: Reviewed and stable  Last Vitals:  Filed Vitals:   02/24/15 1130  BP: 119/59  Pulse: 93  Temp: 37.1 C  Resp: 17    Complications: No apparent anesthesia complications

## 2015-02-24 NOTE — Op Note (Signed)
Preoperative diagnosis:   Postop diagnosis: Same Procedure: da Vinci robot assisted bilateral endometrioma excision, ablation of endometriosis, lysis of adhesions  Anesthesia Gen. Endotracheal Surgeon: Dr. Shea EvansVaishali Jaylan Duggar Assistant: Dr Seymour BarsLavoie IV fluids: 1200 cc LR EBL: 50 cc Urine output: 250 cc, clear in foley Complications: none Pathology: Bilateral endometrioma walls, peritoneal biopsy Disposition: PACU, stable Findings: stage IV endometriosis  Procedure:  Indication: --  34 yo female with known stage IV endometriosis presented with pelvic pain and dyspareunia and office sonogram noted bilateral endometriomas. So decision was made to proceed with surgery.  Complications of surgery including infection, bleeding, damage to internal organs especially ureters, bowels and other surgery related problems including pneumonia, VTE reviewed and informed written consent was obtained. Reviewed risk of recurrence, possible incomplete surgery and need for future surgeries. She understood and gave informed written consent.  Patient was brought to the operating room with IV running. She received 2 gm Ancef . Underwent general anesthesia without difficulty and was given dorsal lithotomy position, prepped and draped in sterile fashion. Foley catheter was placed. Cervix was exposed with a speculum and Zumi manipulator was placed.  Speculum and tenaculum was removed.   Attention was focused on abdomen. Supraumbilical 12 mm transverse incision made in prior scar after injecting Ropivacaine,  fascia dissected, grasped with Kocher's and incised, posterior rectus sheath and peritoneum grasped, incised, intraabdominal entry confirmed. Purse string stay stitch on 0-Vicryl taken on fascia and Hassan cannula introduced and Vicryl sutures secured on the Hassan cannula. Pneumoperitoneum was begun. Laparoscope was introduced and the peritoneal cavity was evaluated.  Trendelenburg position given.   Port sited marked about  the same sites from last surgery and injected with Ropivacaine. Two Robotic canulas inserted on right side and one on the left side and assistant port on the left. Robot was docked from right. PK, Scissors and Prograsp introduced. Dr Seymour BarsLavoie stayed at the bedside and Dr. Juliene PinaMody scrubbed out and went for surgical console.   Findings: Stage IV endometriosis noted. Bilateral endometrioma filled ovaries were kissing in the midline and adherent to posterior uterine serosa. Sigmoid/ rectum noted to be pulled up and adherent to left uterosacral area. Anterior cul de sac was obliterated. Several endometriotic windows and implants noted in the pelvic and lower abdominal area. Ureters were not pulled up in the adhesions of endometriosis. Decision was made not to remove rectal adhesions due to increased risk of bleeding/ tear. First, right ovary was dissected from the uterine adhesions and from posterior cul de sac/ ovarian fossa, endometrioma was entered at a weak point where it had started to drain during dissection, incision was extended and endometrioma wall was dissected off and stepwise fashion, hemostasis controlled. Cyst was wall removed and handed off for pathology. Now the left ovary was dissected from posterior fossa and endometrioma drained from lateral side, opened up and endometrioma was was identified, grasped and dissected off from the ovary and handed off for pathology. Hemostasis was obtained with cautery.  Attempt was made to dissect large bowel (rectum/ sigmoid) from posterior uterus and left uterosacral area but was very dense and hence stopped there. Bleeding was cauterized.  Chromopertubation with Methylene blue performed. Left tube had free spill of dye and right tube seems to be filling but spill was not seen. Adhesions of right tube to lateral ovarian fossa and ovarian pedicle were dense and hence didn't not get freed up. Endometriotic implants were cut and cauterized in the cul de sac. Irrigation  performed.  Interceed placed under both  the ovaries that were freed up and another piece was placed over them and posterior wall of the uterus.  Appendix appeared normal with no endometriotic implants. Upper abdomen and liver/ under surface of diaphragm also appeared normal. Robotic instruments were removed. Robot was de-docked.  Patient was made supine. Lap'scope was reintroduced, hemostasis was excellent. Interceed were in the proper position.  Robotic cannulas were removed under vision. Laparoscope and central port removed under vision. Pneumoperitoneum was suctioned up by the machine. The stay sutures at the fascia tied together with excellent fascial closure. Skin approximated with subcuticular stitches on 4-0 Vicryl. Dermabond was applied. Zumi and foley removed.  All counts were correct x2.   Patient tolerated procedure well and was reversed from anesthesia and brought to the PACU stable condition.   Dr Juliene Pina was the surgeon for entire case.

## 2015-02-24 NOTE — Transfer of Care (Signed)
Immediate Anesthesia Transfer of Care Note  Patient: Maria Fleming  Procedure(s) Performed: Procedure(s) with comments: ROBOTIC ASSISTED LAPAROSCOPIC LYSIS OF ADHESION/Ablation of Endometriosis; bilateral endometrioma cyst excision (N/A) - 3 1/2 hrs.  Patient Location: PACU  Anesthesia Type:General  Level of Consciousness: awake, alert  and oriented  Airway & Oxygen Therapy: Patient Spontanous Breathing and Patient connected to nasal cannula oxygen  Post-op Assessment: Report given to RN and Post -op Vital signs reviewed and stable  Post vital signs: Reviewed and stable  Last Vitals:  Filed Vitals:   02/24/15 1002  BP: 136/73  Pulse: 97  Temp: 36.8 C  Resp: 16    Complications: No apparent anesthesia complications

## 2015-02-24 NOTE — H&P (Signed)
  Maria Fleming is an 34 y.o. Female. G0. With known endometriosis and s/p laparoscopic surgery in Oct'13 and was doing well until earlier this year when bilateral pelvic pain and endometriomas have returned. Patient has infertility despite ovulation and patent tubes on HSG and is likely due to severe/ stage IV endometriosis.   CIN hx in past. Recent nl Paps.  Healthy, no prior abdo/pelvic surgeries. Quit smoking few years back. Married, monogamous, no PID hx.    Past Medical History  Diagnosis Date  . Gastroenteritis     has been seen by specialist, unsure of official dx  . Cervix abnormality     precancerous cells  . Stage IV endometriosis     Past Surgical History  Procedure Date  . Breast lumpectomy   . Wisdom tooth extraction    . Robotic laparoscopic endometriosis surgery in Oct'13.  Family History  Problem Relation Age of Onset  . Diabetes Mother   . Hypertension Mother     Social History:  reports that she has never smoked. She does not have any smokeless tobacco history on file. She reports that she drinks alcohol. She reports that she does not use illicit drugs.  Allergies: No Known Allergies  Prescriptions prior to admission  Medication Sig Dispense Refill  . Biotin 10 MG TABS Take 10 mg by mouth daily.    . MedroxyPROGESTERone Acetate (DEPO-PROVERA IM) Inject 1 each into the muscle every 3 (three) months.    . Multiple Vitamin (MULTIVITAMIN WITH MINERALS) TABS Take 1 tablet by mouth daily.    Marland Kitchen. omeprazole (PRILOSEC) 20 MG capsule Take 20 mg by mouth daily.      Review of Systems  Constitutional: Negative for fever.  Respiratory: Negative for shortness of breath.  Cardiovascular: Negative for chest pain.  Neurological: Negative for headaches.    Physical Exam BP 107/63 mmHg  Pulse 61  Temp(Src) 98.6 F (37 C) (Oral)  Resp 16  SpO2 100%  A&O x 3, no acute distress.  Pleasant HEENT neg, no thyromegaly Lungs CTA bilat CV RRR, S1S2 normal Abdo soft, non tender, non acute Extr no edema/ tenderness Pelvic Bilateral fixed pelvic cysts                Assessment/Plan: Da Vinci Robot assisted Laparoscopic ablation of endometriosis including removal of  bilateral endometriomas.    Risks/complications of surgery reviewed incl infection, bleeding, damage to internal organs including bladder, bowels, ureters, blood vessels, other risks from anesthesia, VTE and delayed complications of any surgery, complications in future surgery reviewed. Also reviewed need for repeat surgery after conservative treatment for endometriosis.    Nicolaos Mitrano R

## 2015-02-26 ENCOUNTER — Encounter (HOSPITAL_COMMUNITY): Payer: Self-pay | Admitting: Obstetrics & Gynecology

## 2015-03-01 ENCOUNTER — Encounter: Payer: BLUE CROSS/BLUE SHIELD | Admitting: Podiatry

## 2015-03-03 ENCOUNTER — Emergency Department
Admission: EM | Admit: 2015-03-03 | Discharge: 2015-03-03 | Disposition: A | Discharge status: Home / Self Care | Payer: BLUE CROSS/BLUE SHIELD | Attending: Emergency Medicine | Admitting: Emergency Medicine

## 2015-03-03 ENCOUNTER — Encounter: Payer: Self-pay | Admitting: Emergency Medicine

## 2015-03-03 DIAGNOSIS — L03311 Cellulitis of abdominal wall: Secondary | ICD-10-CM | POA: Diagnosis not present

## 2015-03-03 DIAGNOSIS — R21 Rash and other nonspecific skin eruption: Secondary | ICD-10-CM | POA: Diagnosis present

## 2015-03-03 DIAGNOSIS — L259 Unspecified contact dermatitis, unspecified cause: Secondary | ICD-10-CM | POA: Diagnosis not present

## 2015-03-03 DIAGNOSIS — Z79899 Other long term (current) drug therapy: Secondary | ICD-10-CM | POA: Insufficient documentation

## 2015-03-03 HISTORY — DX: Endometriosis, unspecified: N80.9

## 2015-03-03 MED ORDER — CEPHALEXIN 500 MG PO CAPS
ORAL_CAPSULE | ORAL | Status: AC
  Administered 2015-03-03: 500 mg via ORAL
  Filled 2015-03-03: qty 1

## 2015-03-03 MED ORDER — CEPHALEXIN 500 MG PO CAPS
500.0000 mg | ORAL_CAPSULE | Freq: Two times a day (BID) | ORAL | Status: AC
Start: 2015-03-03 — End: 2015-03-13

## 2015-03-03 MED ORDER — DIPHENHYDRAMINE HCL 50 MG PO CAPS
50.0000 mg | ORAL_CAPSULE | Freq: Once | ORAL | Status: AC
Start: 2015-03-03 — End: 2015-03-03
  Administered 2015-03-03: 50 mg via ORAL

## 2015-03-03 MED ORDER — CEPHALEXIN 500 MG PO CAPS
500.0000 mg | ORAL_CAPSULE | Freq: Once | ORAL | Status: AC
  Administered 2015-03-03: 500 mg via ORAL

## 2015-03-03 MED ORDER — DIPHENHYDRAMINE HCL 50 MG PO CAPS
ORAL_CAPSULE | ORAL | Status: AC
  Administered 2015-03-03: 50 mg via ORAL
  Filled 2015-03-03: qty 1

## 2015-03-03 NOTE — Discharge Instructions (Signed)
Cellulitis °Cellulitis is an infection of the skin and the tissue under the skin. The infected area is usually red and tender. This happens most often in the arms and lower legs. °HOME CARE  °· Take your antibiotic medicine as told. Finish the medicine even if you start to feel better. °· Keep the infected arm or leg raised (elevated). °· Put a warm cloth on the area up to 4 times per day. °· Only take medicines as told by your doctor. °· Keep all doctor visits as told. °GET HELP IF: °· You see red streaks on the skin coming from the infected area. °· Your red area gets bigger or turns a dark color. °· Your bone or joint under the infected area is painful after the skin heals. °· Your infection comes back in the same area or different area. °· You have a puffy (swollen) bump in the infected area. °· You have new symptoms. °· You have a fever. °GET HELP RIGHT AWAY IF:  °· You feel very sleepy. °· You throw up (vomit) or have watery poop (diarrhea). °· You feel sick and have muscle aches and pains. °MAKE SURE YOU:  °· Understand these instructions. °· Will watch your condition. °· Will get help right away if you are not doing well or get worse. °Document Released: 04/02/2008 Document Revised: 03/01/2014 Document Reviewed: 12/31/2011 °[employer]® Patient Information ©2015 [employer]. This information is not intended to replace advice given to you by your health care provider. Make sure you discuss any questions you have with your health care provider. ° °Contact Dermatitis °Contact dermatitis is a rash that happens when something touches the skin. You touched something that irritates your skin, or you have allergies to something you touched. °HOME CARE  °· Avoid the thing that caused your rash. °· Keep your rash away from hot water, soap, sunlight, chemicals, and other things that might bother it. °· Do not scratch your rash. °· You can take cool baths to help stop itching. °· Only take medicine as told by your  doctor. °· Keep all doctor visits as told. °GET HELP RIGHT AWAY IF:  °· Your rash is not better after 3 days. °· Your rash gets worse. °· Your rash is puffy (swollen), tender, red, sore, or warm. °· You have problems with your medicine. °MAKE SURE YOU:  °· Understand these instructions. °· Will watch your condition. °· Will get help right away if you are not doing well or get worse. °Document Released: 08/12/2009 Document Revised: 01/07/2012 Document Reviewed: 03/20/2011 °[employer]® Patient Information ©2015 [employer]. This information is not intended to replace advice given to you by your health care provider. Make sure you discuss any questions you have with your health care provider. ° °

## 2015-03-03 NOTE — ED Provider Notes (Signed)
[facility] Emergency Department Provider Note    ____________________________________________  Time seen: 5:30 AM  I have reviewed the triage vital signs and the nursing notes.   HISTORY  Chief Complaint Rash       HPI Maria Fleming is a 34 y.o. female presents with pruritic rash on abdomen and areas where tape was applied post surgery.Patient had laparoscopic surgery performed for endometriosis [facility] on 02/17/15. Patient admits to progressive pruritic rash in areas where tape was applied. In addition patient also admits to yellow drainage from umbilical incision as well as erythema surrounding surgical sites on the abdomen.      Past Medical History  Diagnosis Date  . Gastroenteritis     has been seen by specialist, unsure of official dx  . Cervix abnormality     precancerous cells  . No pertinent past medical history   . Endometriosis     Patient Active Problem List   Diagnosis Date Noted  . Endometriosis of pelvis 02/24/2015    Past Surgical History  Procedure Laterality Date  . Breast lumpectomy    . Wisdom tooth extraction    . Ovarian cyst removal  08/21/2012    Procedure: OVARIAN CYSTECTOMY;  Surgeon: Robley FriesVaishali R [name], MD;  Location: [facility] ORS;  Service: Gynecology;  Laterality: Bilateral;  . Robotic assisted laparoscopic lysis of adhesion N/A 02/24/2015    Procedure: ROBOTIC ASSISTED LAPAROSCOPIC LYSIS OF ADHESION/Ablation of Endometriosis; bilateral endometrioma cyst excision;  Surgeon: Shea EvansVaishali [name], MD;  Location: [facility] OR[facility];  Service: Gynecology;  Laterality: N/A;  3 1/2 hrs.  . Cystectomy w/ ureterosigmoidostomy  02/24/2015    Current Outpatient Rx  Name  Route  Sig  Dispense  Refill  . Alpha-D-Galactosidase (BEANO) TABS   Oral   Take 1 tablet by mouth daily as needed (For gas.).         Marland Kitchen. Biotin 10 MG TABS   Oral   Take 10 mg by mouth daily.         Marland Kitchen. ibuprofen (ADVIL) 200 MG tablet   Oral   Take 3 tablets (600  mg total) by mouth every 6 (six) hours as needed.   30 tablet   0   . oxyCODONE-acetaminophen (ROXICET) 5-325 MG per tablet   Oral   Take 2 tablets by mouth every 6 (six) hours as needed for severe pain.   30 tablet   0   . polyethylene glycol (MIRALAX / GLYCOLAX) packet   Oral   Take 17 g by mouth once a week.         . Prenatal Vit-Fe Fumarate-FA (PRENATAL MULTIVITAMIN) TABS tablet   Oral   Take 1 tablet by mouth daily at 12 noon.         . Probiotic Product (PROBIOTIC PO)   Oral   Take 1 capsule by mouth 2 (two) times daily.            Allergies Review of patient's allergies indicates no known allergies.  Family History  Problem Relation Age of Onset  . Diabetes Mother   . Hypertension Mother     Social History History  Substance Use Topics  . Smoking status: Never Smoker   . Smokeless tobacco: Not on file  . Alcohol Use: 0.6 oz/week    1 Standard drinks or equivalent per week     Comment: occasonally    Review of Systems  Constitutional: Negative for fever. Eyes: Negative for visual changes. ENT: Negative for sore throat. Cardiovascular:  Negative for chest pain. Respiratory: Negative for shortness of breath. Gastrointestinal: Negative for abdominal pain, vomiting and diarrhea. Genitourinary: Negative for dysuria. Musculoskeletal: Negative for back pain. Skin: Pruritic rash on abdomen Neurological: Negative for headaches, focal weakness or numbness.   10-point ROS otherwise negative.  ____________________________________________   PHYSICAL EXAM:  VITAL SIGNS: ED Triage Vitals  Enc Vitals Group     BP 03/03/15 0245 129/79 mmHg     Pulse Rate 03/03/15 0245 69     Resp --      Temp 03/03/15 0245 98.3 F (36.8 C)     Temp Source 03/03/15 0245 Oral     SpO2 03/03/15 0245 100 %     Weight 03/03/15 0245 148 lb (67.132 kg)     Height 03/03/15 0245 5\' 6"  (1.676 m)     Head Cir --      Peak Flow --      Pain Score 03/03/15 0515 8     Pain  Loc --      Pain Edu? --      Excl. in GC? --      Constitutional: Alert and oriented. Well appearing and in no distress. Eyes: Conjunctivae are normal. PERRL. Normal extraocular movements. ENT   Head: Normocephalic and atraumatic.   Nose: No congestion/rhinnorhea.   Mouth/Throat: Mucous membranes are moist.   Neck: No stridor. Hematological/Lymphatic/Immunilogical: No cervical lymphadenopathy. Cardiovascular: Normal rate, regular rhythm. Normal and symmetric distal pulses are present in all extremities. No murmurs, rubs, or gallops. Respiratory: Normal respiratory effort without tachypnea nor retractions. Breath sounds are clear and equal bilaterally. No wheezes/rales/rhonchi. Gastrointestinal: Soft and nontender. No distention. No abdominal bruits. There is no CVA tenderness. Genitourinary: Deferred Musculoskeletal: Nontender with normal range of motion in all extremities. No joint effusions.  No lower extremity tenderness nor edema. Neurologic:  Normal speech and language. No gross focal neurologic deficits are appreciated. Speech is normal. No gait instability. Skin:  Maculopapular rash noted around surgical incisions on the abdomen. Purulent drainage noted from umbilical surgical site. Psychiatric: Mood and affect are normal. Speech and behavior are normal. Patient exhibits appropriate insight and judgment.  ____________________________________________    LABS (pertinent positives/negatives)  None performed  ____________________________________________   EKG  None performed  ____________________________________________    RADIOLOGY  None performed  ____________________________________________   PROCEDURES    ____________________________________________   INITIAL IMPRESSION / ASSESSMENT AND PLAN / ED COURSE  Pertinent labs & imaging results that were available during my care of the patient were reviewed by me and considered in my medical  decision making (see chart for details).  History physical exam consistent with contact dermatitis most likely secondary to adhesive tape that was used. In addition given surrounding erythema and drainage from the umbilical wound suspect cellulitis as well. As such patient received Benadryl 50 mg by mouth and Keflex 500 mg by mouth in the emergency department.  ____________________________________________   FINAL CLINICAL IMPRESSION(S) / ED DIAGNOSES  Final diagnoses:  Contact dermatitis  Cellulitis of abdominal wall     Darci Currentandolph N Andrej Spagnoli, MD 03/03/15 [phone]

## 2015-03-03 NOTE — ED Notes (Signed)
Pt arrived to the ED for complaints of abdominal pain and rash over surgical incision. Discharge coming around surgical incision. Pt is AOx4 in no apparent distress.

## 2015-03-03 NOTE — ED Notes (Signed)
Pt had laparoscopic surgery for endometriosis and had localized reaction to tape, now has all over torso and thighs that is itching since Tuesday.

## 2015-03-29 IMAGING — RF DG HYSTEROGRAM
5 series · 5 of 5 positions shown · IV contrast (omnipaque)
Comparison: None.

FLUOROSCOPY TIME:  42 seconds

CLINICAL DATA: Primary infertility.

EXAM:
HYSTEROSALPINGOGRAM
TECHNIQUE: Following cleansing of the cervix and vagina with Betadine solution,
a hysterosalpingogram was performed using a 5-French
hysterosalpingogram catheter and Omnipaque 300 contrast. The patient
tolerated the examination without difficulty.

[Series 1: run · 1 of 1 slices shown (1 of 5)]
[im 1/1]
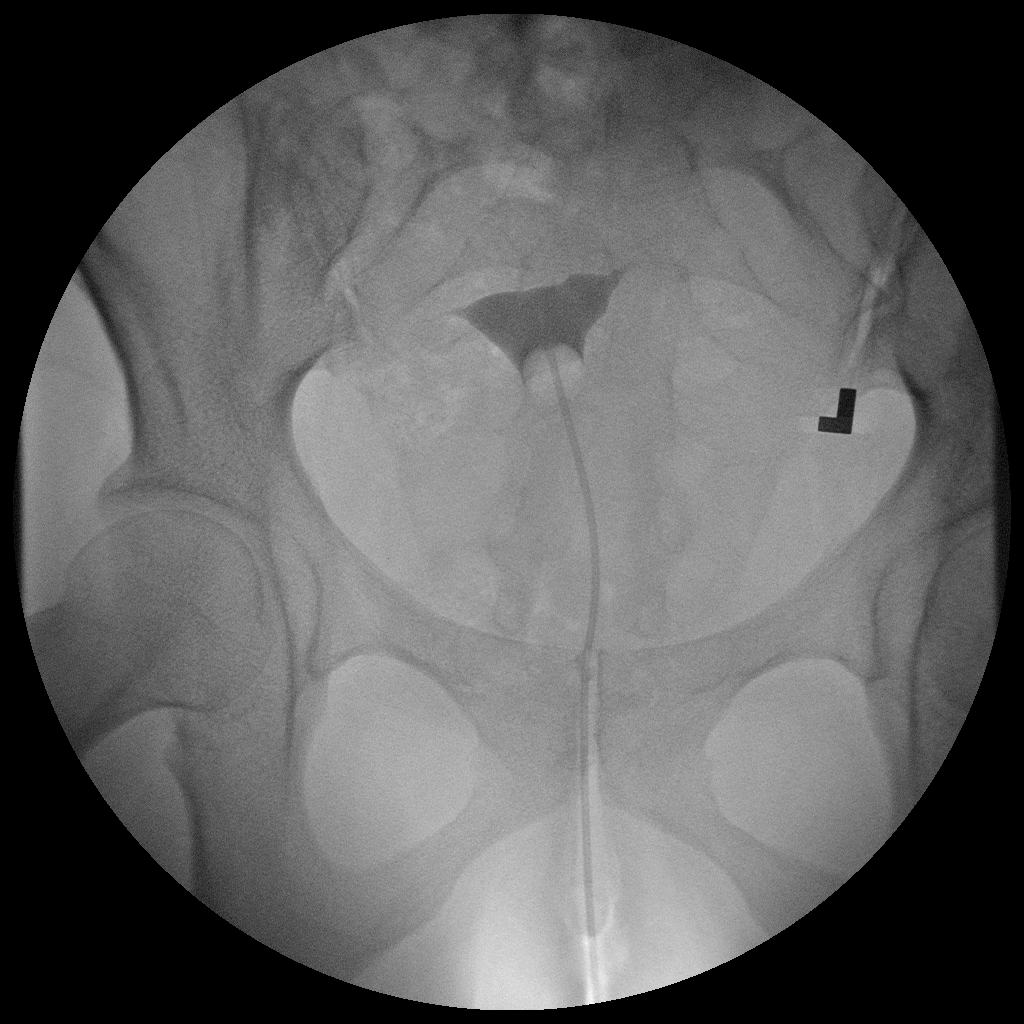

[Series 2: run · 1 of 1 slices shown (2 of 5)]
[im 1/1]
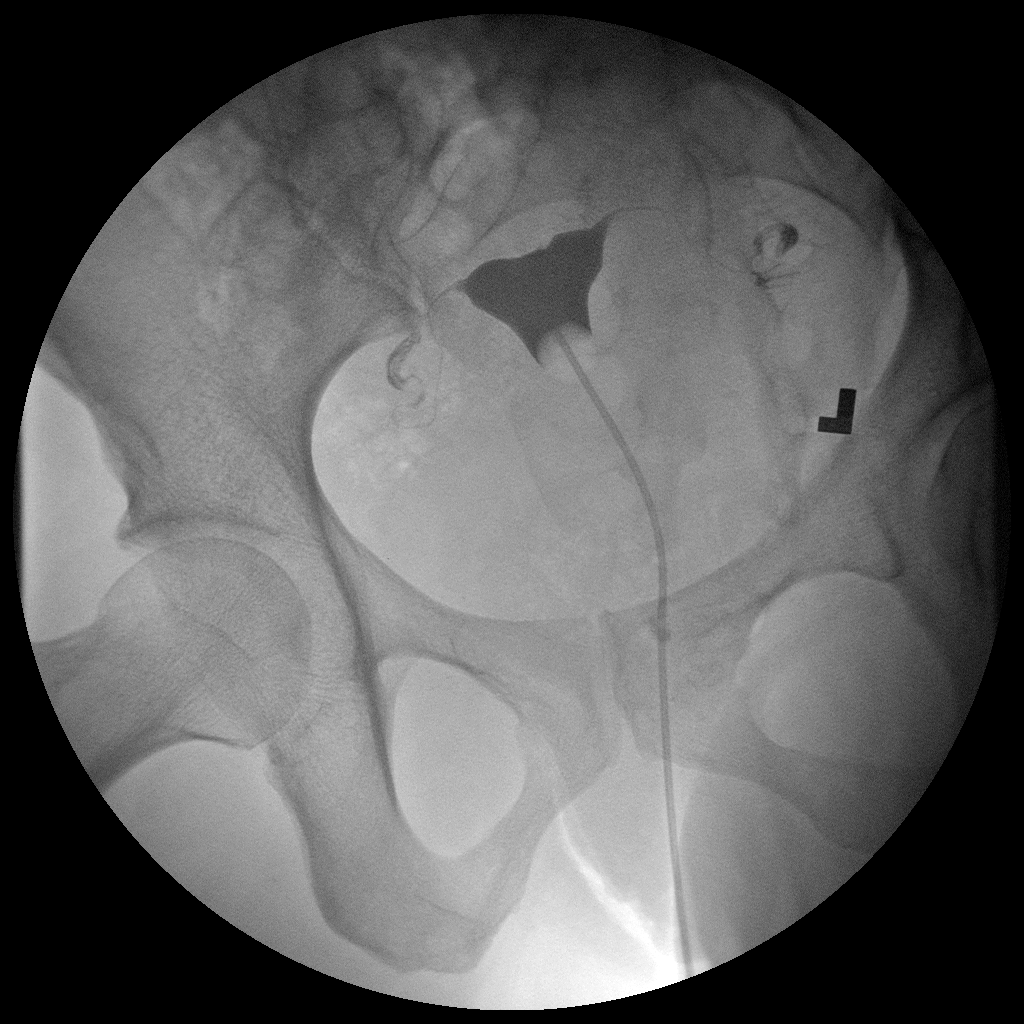

[Series 3: run · 1 of 1 slices shown (3 of 5)]
[im 1/1]
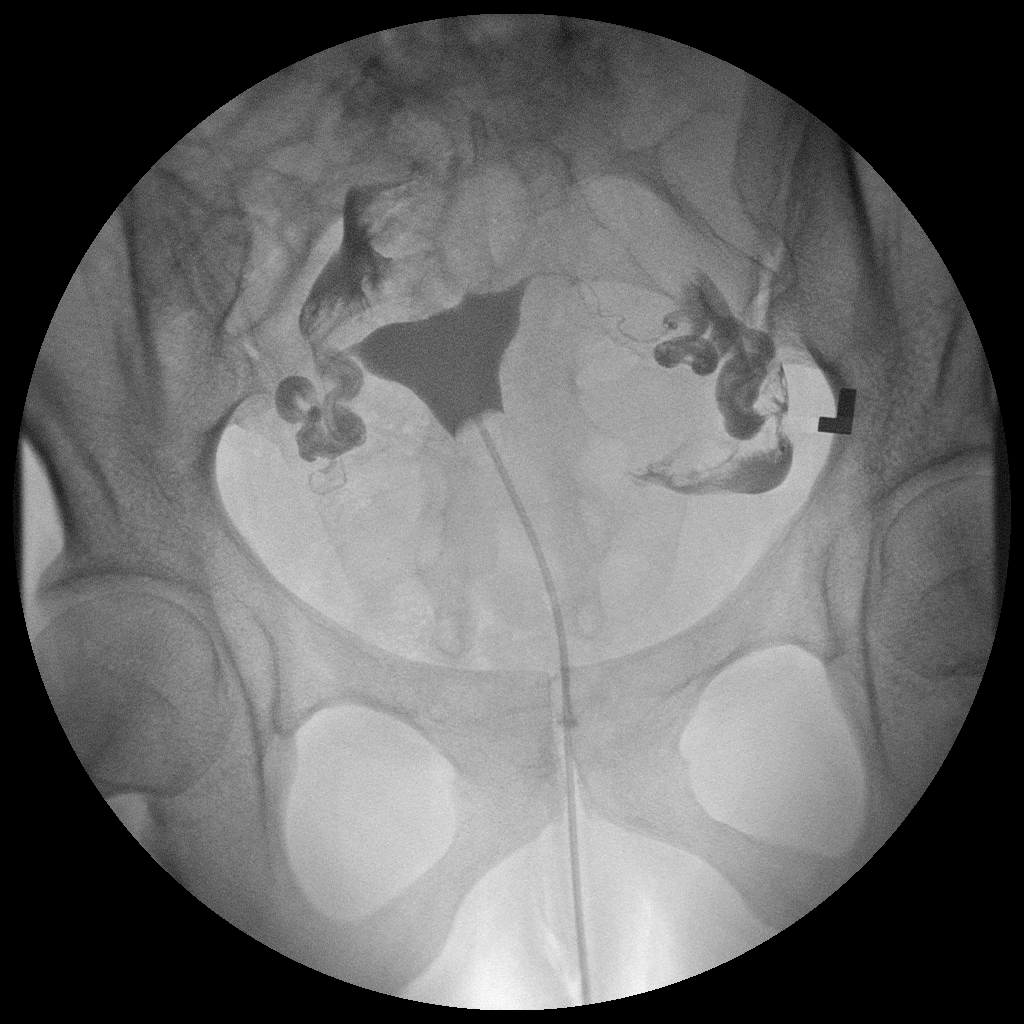

[Series 4: run · 1 of 1 slices shown (4 of 5)]
[im 1/1]
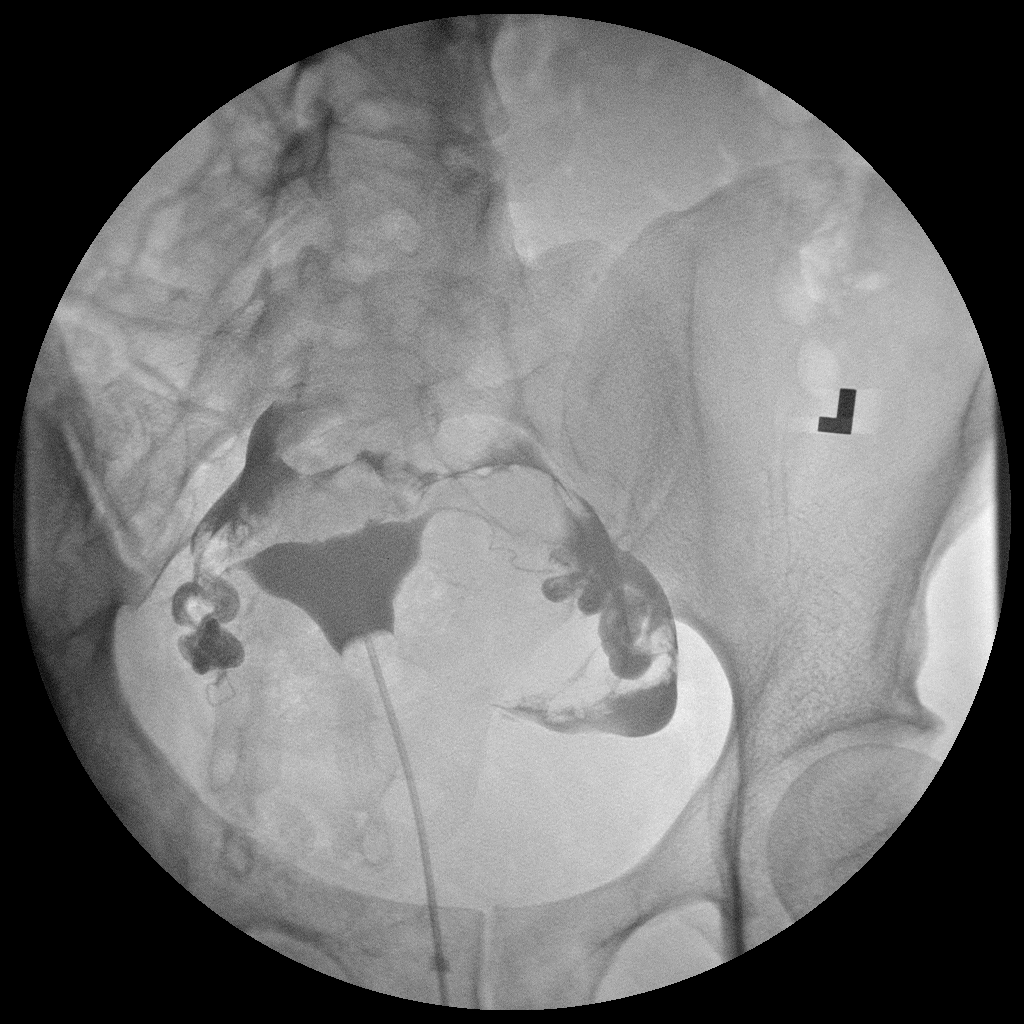

[Series 5: run · 1 of 1 slices shown (5 of 5)]
[im 1/1]
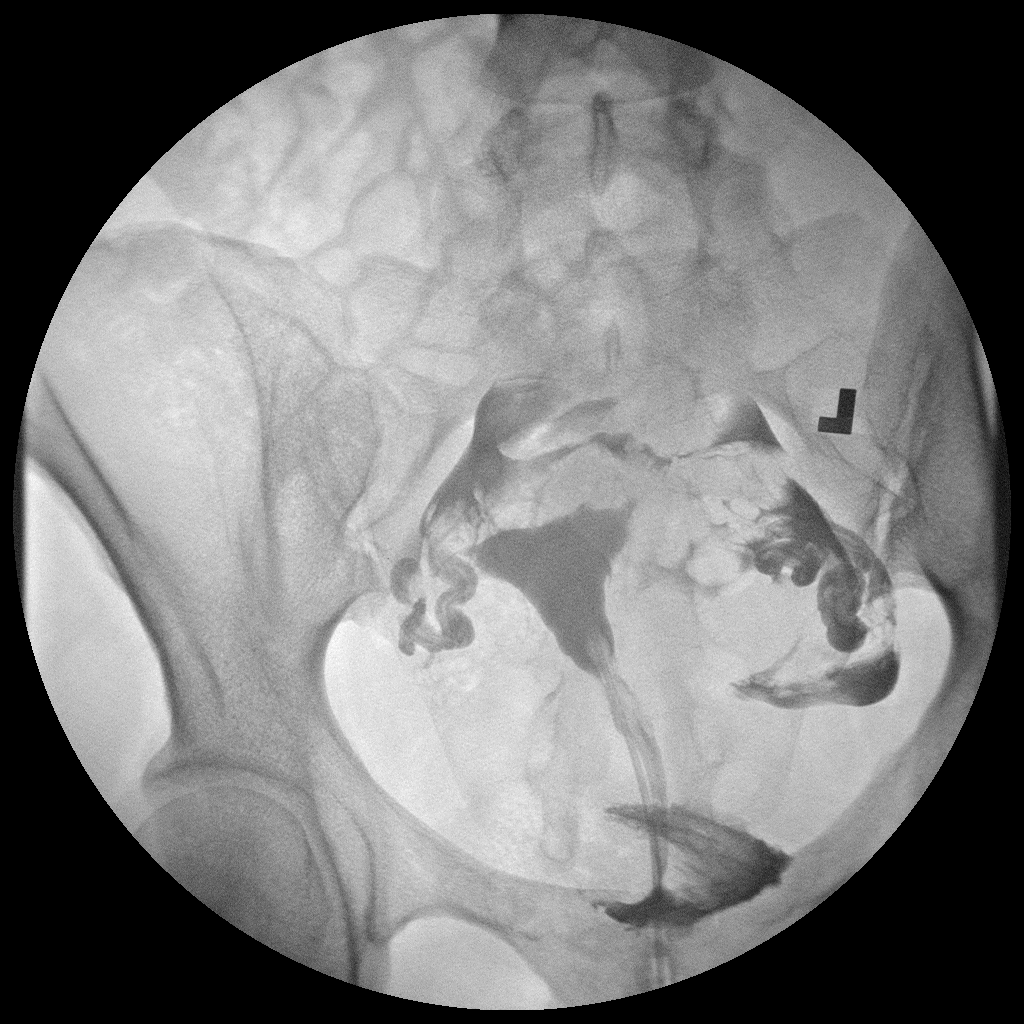

[5 of 5 positions shown; findings below may reference images not displayed]

FINDINGS: Uterine cavity contour is normal. Bilateral fallopian tubes have
normal contour and peritoneal spill.
IMPRESSION: Normal HSG.

## 2015-04-19 ENCOUNTER — Telehealth: Payer: Self-pay | Admitting: *Deleted

## 2015-04-19 NOTE — Telephone Encounter (Signed)
"  Calling to reschedule my surgery with Dr. Ardelle Anton."

## 2015-04-20 NOTE — Telephone Encounter (Signed)
Called and left message for patient to call me back to schedule surgery.

## 2015-05-06 ENCOUNTER — Telehealth: Payer: Self-pay | Admitting: Podiatry

## 2015-05-06 NOTE — Telephone Encounter (Signed)
PT CALLED TO MAKE SURE HER SHORT TERM DISABILITY FORMS WERE RECEIVED IN THE [place] OFFICE. THEY WERE FAXED LAST WEEK.PLEASE CALL PT

## 2015-05-06 NOTE — Telephone Encounter (Signed)
Left message for Maria Fleming that we did not receive any paperwork at this time, gave her the correct fax number , call back with any questions

## 2015-05-10 ENCOUNTER — Telehealth: Payer: Self-pay | Admitting: Podiatry

## 2015-05-10 ENCOUNTER — Encounter: Payer: Self-pay | Admitting: *Deleted

## 2015-05-10 NOTE — Telephone Encounter (Signed)
Pt states she faxed Disability paperwork to [phone], please complete and call with questions.

## 2015-05-10 NOTE — Telephone Encounter (Signed)
Paperwork is filled out and faxed, will also send note to patient employer

## 2015-05-10 NOTE — Telephone Encounter (Signed)
PT CALLED AND NEEDS A NOTE FAXED TO HER JOB STATING SHE IS HAVING SURGERY ON 7.20.16. THE FAX # [phone] ATTEN TO [facility]. PLEASE CALL PT IF ANY QUESTIONS. ALSO PT DID GET JODI'S VM FROM YESTERDAY AND FAXED HER FMLA  YESTERDAY TO THE NUMBER THAT WAS PROVIDED.

## 2015-05-16 ENCOUNTER — Telehealth: Payer: Self-pay | Admitting: *Deleted

## 2015-05-16 NOTE — Telephone Encounter (Signed)
Pt states she can't use the surgical cleaner and brush given for pre-op, what does she need to do?  I told her to clean her foot as usual and inform the [facility] of her allergy to the surgical scrub provided.  Pt states understanding.

## 2015-05-18 DIAGNOSIS — M2011 Hallux valgus (acquired), right foot: Secondary | ICD-10-CM | POA: Diagnosis not present

## 2015-05-24 ENCOUNTER — Ambulatory Visit (INDEPENDENT_AMBULATORY_CARE_PROVIDER_SITE_OTHER): Payer: BLUE CROSS/BLUE SHIELD

## 2015-05-24 ENCOUNTER — Ambulatory Visit (INDEPENDENT_AMBULATORY_CARE_PROVIDER_SITE_OTHER): Payer: BLUE CROSS/BLUE SHIELD | Admitting: Podiatry

## 2015-05-24 VITALS — BP 137/99 | HR 73 | Temp 99.0°F | Resp 16

## 2015-05-24 DIAGNOSIS — Z9889 Other specified postprocedural states: Secondary | ICD-10-CM

## 2015-05-24 DIAGNOSIS — M2011 Hallux valgus (acquired), right foot: Secondary | ICD-10-CM

## 2015-05-26 NOTE — Progress Notes (Signed)
Patient ID: Maria Fleming, female   DOB: [birth year], 34 y.o.   MRN: [id number]  DOS: 05/18/15 s/p Right Serafina Royals  Subjective: 34 year old female presents the office today postop visit #1 status post right foot Austin bunionectomy. She says that overall she is doing well and her pain is currently controlled. She's been taking antibiotic as directed. She is not required the phenergan.  She denies any systemic complaints as fevers, chills, nausea, vomiting. Denies any calf pain, chest pain, short of breath. With this time no acute changes since last point.  Objective: AAO x3, NAD DP/PT pulses palpable bilaterally, CRT less than 3 seconds Protective sensation intact with Simms Weinstein monofilament Incision on the dorsal medial aspect as well coapted without any evidence of dehiscence and sutures are intact. There is no swelling erythema, ascending saline disc, fluctuance, crepitus, malodor, drainage. There is no clinical signs of infection. There is mild edema overlying the surgical site. There is slight tenderness to palpation upon surgical site. There is no pain with first MTPJ range of motion and range of motion is intact. No other areas of tenderness to bilateral lower extremities.   No open lesions or pre-ulcerative lesions.  No overlying edema, erythema, increase in warmth to bilateral lower extremities.  No pain with calf compression, swelling, warmth, erythema bilaterally.   Assessment: 34 year old female 1 week status post right bunionectomy, doing well  Plan: -X-rays were obtained and reviewed with the patient.  -Treatment options discussed including all alternatives, risks, and complications -Antibiotic ointment was placed over the incision followed by dry sterile dressing. Keep his dressing clean, dry, intact. -Continue with CAM walker. -Ice and elevation -Pain medication as needed -Monitor for any clinical signs or symptoms of infection and DVT/PE and directed to call the  office immediately should any occur or go to the ER. -Follow-up 1 week for suture removal or sooner if any problems arise. In the meantime, encouraged to call the office with any questions, concerns, change in symptoms.   Ovid Curd, DPM

## 2015-05-31 ENCOUNTER — Ambulatory Visit (INDEPENDENT_AMBULATORY_CARE_PROVIDER_SITE_OTHER): Payer: BLUE CROSS/BLUE SHIELD | Admitting: Podiatry

## 2015-05-31 VITALS — BP 116/79 | HR 76 | Temp 98.4°F | Resp 16

## 2015-05-31 DIAGNOSIS — M2012 Hallux valgus (acquired), left foot: Secondary | ICD-10-CM

## 2015-05-31 DIAGNOSIS — Z9889 Other specified postprocedural states: Secondary | ICD-10-CM

## 2015-05-31 NOTE — Progress Notes (Signed)
Patient ID: Maria Fleming, female   DOB: 1981/04/02, 34 y.o.   MRN: [id number]  DOS: 05/18/15 s/p Right Serafina Royals  Subjective: 34 year old female presents the office today postop visit #2 status post right foot Austin bunionectomy. She says that overall she is doing well and her pain is currently controlled and she is doing "great".  She is inquiring about scheduling surgery for left-sided bunion. She continues have pain left-sided bunion to shoe gear and pressure overlying the bunion deformity. She said that she can modifications off without any relief of symptoms. This time she is requesting surgical intervention to help decrease her pain and deformity.She denies any systemic complaints as fevers, chills, nausea, vomiting. Denies any calf pain, chest pain, short of breath. With this time no acute changes since last point.  Objective: AAO x3, NAD DP/PT pulses palpable bilaterally, CRT less than 3 seconds Protective sensation intact with Simms Weinstein monofilament Incision on the dorsal medial aspect as well coapted without any evidence of dehiscence and sutures are intact. There is no surrounding erythema, ascending cellulitis, fluctuance, crepitus, malodor, drainage. There is no clinical signs of infection. There is trace edema overlying the surgical site. There is slight tenderness to palpation upon surgical site. There is no pain with first MTPJ range of motion and range of motion is intact. There is a moderate HAV deformity present on the left side with tenderness to palpation along the medial aspect of the first metatarsal head. There is no pain or crepitation first MTPJ range of motion. No hypermobility. No other areas of tenderness to bilateral lower extremities.   No open lesions or pre-ulcerative lesions.  No overlying edema, erythema, increase in warmth to bilateral lower extremities.  No pain with calf compression, swelling, warmth, erythema bilaterally.   Assessment: 34 year old  female 2 week status post right bunionectomy, doing well; Left HAV  Plan: -X-rays were obtained and reviewed with the patient.  -Treatment options discussed including all alternatives, risks, and complications -sutures are removed without complications.Antibiotic ointment was placed over the incision followed by dry sterile dressing. Keep his dressing clean, dry, intact. She can remove the dressing tomorrow start to shower as long as the incision remains coapted. It is any problems call the office. -Continue with CAM walker. -Ice and elevation -Pain medication as needed -Dispense Darco bunion splint. -Discuss surgical intervention for the left foot bunion deformity. At times has attempted conservative treatment without any relief. She is requesting surgical intervention to help decrease her pain and deformity. I discussed her left foot Austin bunionectomy with screw fixation. The incision placement as well as the postoperative course was discussed with the patient. I discussed risks of the surgery which include, but not limited to, infection, bleeding, pain, swelling, need for further surgery, delayed or nonhealing, painful or ugly scar, numbness or sensation changes, over/under correction, recurrence, transfer lesions, further deformity, hardware failure, DVT/PE, loss of toe/foot. Patient understands these risks and wishes to proceed with surgery. The surgical consent was reviewed with the patient all 3 pages were signed. No promises or guarantees were given to the outcome of the procedure. All questions were answered to the best of my ability. Before the surgery the patient was encouraged to call the office if there is any further questions. The surgery will be performed at the [facility] on an outpatient basis. -Monitor for any clinical signs or symptoms of infection and DVT/PE and directed to call the office immediately should any occur or go to the ER. -Follow-up 2 weesk for  or sooner if any problems  arise. In the meantime, encouraged to call the office with any questions, concerns, change in symptoms.  -Xray right foot next appt  Ovid Curd, DPM

## 2015-06-06 DIAGNOSIS — M79673 Pain in unspecified foot: Secondary | ICD-10-CM

## 2015-06-14 ENCOUNTER — Encounter: Payer: Self-pay | Admitting: Podiatry

## 2015-06-14 ENCOUNTER — Ambulatory Visit (INDEPENDENT_AMBULATORY_CARE_PROVIDER_SITE_OTHER): Payer: BLUE CROSS/BLUE SHIELD

## 2015-06-14 ENCOUNTER — Ambulatory Visit (INDEPENDENT_AMBULATORY_CARE_PROVIDER_SITE_OTHER): Payer: BLUE CROSS/BLUE SHIELD | Admitting: Podiatry

## 2015-06-14 VITALS — BP 116/78 | HR 76 | Resp 18

## 2015-06-14 DIAGNOSIS — M2011 Hallux valgus (acquired), right foot: Secondary | ICD-10-CM

## 2015-06-14 DIAGNOSIS — Z9889 Other specified postprocedural states: Secondary | ICD-10-CM

## 2015-06-14 DIAGNOSIS — M2012 Hallux valgus (acquired), left foot: Secondary | ICD-10-CM

## 2015-06-14 NOTE — Progress Notes (Signed)
Patient ID: Maria Fleming, female   DOB: December 06, 1980, 34 y.o.   MRN: [id number]  DOS: 05/18/15 s/p Right Serafina Royals  Subjective: 34 year old female presents the office today postop visit #3 status post right foot Austin bunionectomy. She says that overall she is doing well and her pain is currently controlled and she is doing "great" and not requiring any pain medication. She does continue with the Cam Walker the any problems.She denies any systemic complaints as fevers, chills, nausea, vomiting. Denies any calf pain, chest pain, short of breath. With this time no acute changes since last point.  Objective: AAO x3, NAD DP/PT pulses palpable bilaterally, CRT less than 3 seconds Protective sensation intact with Simms Weinstein monofilament Incision on the dorsal medial aspect as well coapted without any evidence of dehiscence. There is minimal edema over the surgical site. There is no surrounding erythema, ascending cellulitis, fluctuance, crepitus, malodor, drainage. There is no clinical signs of infection. There is no tenderness to palpation along the surgical site at this time. Right first MTPJ range of motion is intact without any pain with range of motion. There is a moderate HAV deformity present on the left side with tenderness to palpation along the medial aspect of the first metatarsal head. There is no pain or crepitation first MTPJ range of motion. No hypermobility. No other areas of tenderness to bilateral lower extremities.   No open lesions or pre-ulcerative lesions.  No overlying edema, erythema, increase in warmth to bilateral lower extremities.  No pain with calf compression, swelling, warmth, erythema bilaterally.   Assessment: 34 year old female 4 week status post right bunionectomy, doing well  Plan: -X-rays were obtained and reviewed with the patient.  -Treatment options discussed including all alternatives, risks, and complications -At this time to transition to a Darco  shoe which was dispensed to her today. -encouraged range of motion activities the first MTPJ. -Ice elevation -Bunion splint. -She can return to work as long as she can wear her surgical shoe her Darco shoe as she consented to her work. Ice elevation. There is an increase in pain at work and to call the office and hold off on returning to work. -X-ray right foot next appt.  Ovid Curd, DPM

## 2015-06-15 ENCOUNTER — Telehealth: Payer: Self-pay | Admitting: *Deleted

## 2015-06-15 NOTE — Telephone Encounter (Signed)
Maria Fleming is taking care of her FMLA.

## 2015-06-15 NOTE — Telephone Encounter (Signed)
Pt states her insurance company was sending a form that had to be faxed back in 7 days concerning her 07/13/2015 surgery on her left foot.

## 2015-06-28 ENCOUNTER — Ambulatory Visit (INDEPENDENT_AMBULATORY_CARE_PROVIDER_SITE_OTHER): Payer: BLUE CROSS/BLUE SHIELD

## 2015-06-28 ENCOUNTER — Encounter: Payer: Self-pay | Admitting: Podiatry

## 2015-06-28 ENCOUNTER — Ambulatory Visit (INDEPENDENT_AMBULATORY_CARE_PROVIDER_SITE_OTHER): Payer: BLUE CROSS/BLUE SHIELD | Admitting: Podiatry

## 2015-06-28 VITALS — BP 120/70 | HR 85 | Resp 16

## 2015-06-28 DIAGNOSIS — Z9889 Other specified postprocedural states: Secondary | ICD-10-CM

## 2015-06-28 DIAGNOSIS — M2011 Hallux valgus (acquired), right foot: Secondary | ICD-10-CM

## 2015-06-28 NOTE — Progress Notes (Signed)
Patient ID: Maria Fleming, female   DOB: Mar 20, 1981, 34 y.o.   MRN: [id number]  Subjective: 34 year old female presents the office today status post right foot Austin bunion 3 performed on 05/18/2015. She states that overall she is doing well and she does not have any pain to the surgical site. She continues to wear the surgical shoe. She is also return to work without any problems. She is having surgery on the left foot in the middle of September. She currently denies any systemic complaints as fevers, chills, nausea, vomiting. Denies any calf pain, chest pain, shortness of breath. No other complaints at this time in no acute changes.  Objective: AAO 3, NAD Neurovascular status intact bilaterally and unchanged. Incisional the dorsal medial aspect of the right foot as well coapted without any evidence of dehiscence and a scar has formed. There is trace edema overlying the area. There is mild tenderness to palpation around surgical site however she discussed worse been uncomfortable as opposed pain. There is no pain or crepitation or restriction the first MTPJ range of motion.the hallux sits in a rectus position. There is continuation of a painful bunion on the left foot as well. No other areas of tenderness to bilateral lower extremity. There is no overlying edema, erythema, increase in warmth elsewhere. No pain with calf compression, swelling, warmth, erythema.  Assessment: Status post right foot bunionectomy  Plan: X-rays were obtained and reviewed with the patient. It does appear that the capital fragment has shifted somewhat. Clinically the toe remains in a rectus position. However we will continue with a surgical shoe for now and hold off on transitioning back into his shoe to ensure adequate healing. Continue with ankle compression stocking as well as ice and elevation. Pain medication as needed. I will see her back prior to her scheduled surgery in the left foot to ensure healing of the right foot.  In the meantime I encouraged her to call the office with any questions, concerns, change in symptoms. *xray right foot next appt  Ovid Curd, DPM

## 2015-07-07 ENCOUNTER — Encounter: Payer: Self-pay | Admitting: Podiatry

## 2015-07-07 ENCOUNTER — Ambulatory Visit (INDEPENDENT_AMBULATORY_CARE_PROVIDER_SITE_OTHER): Payer: BLUE CROSS/BLUE SHIELD | Admitting: Podiatry

## 2015-07-07 ENCOUNTER — Ambulatory Visit (INDEPENDENT_AMBULATORY_CARE_PROVIDER_SITE_OTHER): Payer: BLUE CROSS/BLUE SHIELD

## 2015-07-07 VITALS — BP 117/77

## 2015-07-07 DIAGNOSIS — M2011 Hallux valgus (acquired), right foot: Secondary | ICD-10-CM | POA: Diagnosis not present

## 2015-07-07 DIAGNOSIS — Z9889 Other specified postprocedural states: Secondary | ICD-10-CM

## 2015-07-07 MED ORDER — CEPHALEXIN 500 MG PO CAPS: 500.0000 mg | ORAL_CAPSULE | Freq: Three times a day (TID) | ORAL | Status: DC

## 2015-07-07 NOTE — Progress Notes (Signed)
Patient ID: Maria Fleming, female   DOB: April 20, 1981, 34 y.o.   MRN: [id number]  Subjective: 34 year old female presents the office today status post right foot Austin bunion  performed on 05/18/2015. She states that overall she is doing well and she does not have any pain to the surgical site. She continues to wear the surgical shoe. She is scheduled for surgery to her left foot Stay.She currently denies any systemic complaints as fevers, chills, nausea, vomiting. Denies any calf pain, chest pain, shortness of breath. No other complaints at this time in no acute changes.  Objective: AAO 3, NAD Neurovascular status intact bilaterally and unchanged. Incisions on the dorsal medial aspect of the right foot as well coapted without any evidence of dehiscence and a scar has formed. There is trace edema overlying the area. There is very mild tenderness to palpation around surgical site upon palpation.  There is no pain or crepitation or restriction the first MTPJ range of motion. The hallux sits in a rectus position. There is continuation of a painful bunion on the left foot as well. No other areas of tenderness to bilateral lower extremity. There is no overlying edema, erythema, increase in warmth elsewhere. No pain with calf compression, swelling, warmth, erythema.  Assessment: Status post right foot bunionectomy, doing well   Plan: -X-rays were obtained and reviewed with the patient. There appears to be increased consolidation across the osteotomy site. -Treatment options discussed including all alternatives, risks, and complications -At this time she can start to transition into a regular shoe on the right foot. Continue ice and elevation. Compression anklet for swelling. Can continue to splint as needed. -She is scheduled for surgery in the left foot next week. We are okay to proceed with surgery this time. She states that she has been a pain medication left over from last surgery as well as Phenergan. I  went ahead and sent over Keflex for her to start after surgery. She verbalized understanding this. -She will follow-up after surgery or sooner if any problems are to arise. In the meantime I encouraged her to call the office with any questions, concerns, change in symptoms.  Ovid Curd, DPM

## 2015-07-11 ENCOUNTER — Telehealth: Payer: Self-pay | Admitting: *Deleted

## 2015-07-11 NOTE — Telephone Encounter (Signed)
Pt states the rx Dr. Ardelle Anton wanted her to pick up prior to her surgery was not at the pharmacy.  I reviewed our medication records and Dr. Ardelle Anton had called the Keflex in to the [facility] on [address] in [place] on 07/07/2015.  I informed pt and she said she would go by there tomorrow.

## 2015-07-13 ENCOUNTER — Encounter: Payer: Self-pay | Admitting: *Deleted

## 2015-07-13 DIAGNOSIS — M2012 Hallux valgus (acquired), left foot: Secondary | ICD-10-CM

## 2015-07-19 ENCOUNTER — Ambulatory Visit (INDEPENDENT_AMBULATORY_CARE_PROVIDER_SITE_OTHER): Payer: BLUE CROSS/BLUE SHIELD

## 2015-07-19 ENCOUNTER — Ambulatory Visit (INDEPENDENT_AMBULATORY_CARE_PROVIDER_SITE_OTHER): Payer: BLUE CROSS/BLUE SHIELD | Admitting: Podiatry

## 2015-07-19 ENCOUNTER — Encounter: Payer: Self-pay | Admitting: Podiatry

## 2015-07-19 VITALS — BP 113/70 | HR 79 | Resp 18

## 2015-07-19 DIAGNOSIS — Z9889 Other specified postprocedural states: Secondary | ICD-10-CM

## 2015-07-19 DIAGNOSIS — M2012 Hallux valgus (acquired), left foot: Secondary | ICD-10-CM

## 2015-07-19 NOTE — Progress Notes (Signed)
Patient ID: Avo Schlachter, female   DOB: [birth year], 34 y.o.   MRN: [id number]  DOS: 07/13/15 s/p Left Serafina Royals   Subjective: 34 year old female presents the office they for Evaluation of left foot bunion surgery one week. She says that she is doing "very well". She is taking ibuprofen for pain and not requiring any narcotics. She's been taken antibiotics as directed. She took some Phenergan immediate after surgery however is not required recently. She is continued with a cam boot. Denies any pain to her right foot and states that the foot is doing well. She denies any systemic complaints as fevers, chills, nausea, vomiting. Denies any calf pain, chest pain, shortness of breath. No other complaints at this time in no acute changes otherwise.  Objective: AAO 3, NAD, presents and cam boot Dressings clean, dry, intact. Neurovascular status intact and unchanged Incision the dorsal medial aspect the left foot as well coapted without any evidence of dehiscence and sutures are intact. There is no swelling erythema, ascending cellulitis, fluctuance, crepitus, malodor, drainage/purulence. There is mild edema along the surgical site. There is no tenderness palpation over the surgical site. First MTPJ range of motion is intact without any pain. Toes is in a rectus position. There is no pain of the right foot. No other open lesions or pre-ulcerative lesions. No pain with calf compression, swelling, warmth, erythema.  Assessment: 34 year old female 1 week status post left foot bunionectomy, doing well  Plan: -X-rays were obtained and reviewed with the patient.  -Treatment options discussed including all alternatives, risks, and complications -Antibiotic ointment was placed over the incisions followed by dry sterile dressing. Keep dressing clean, dry, intact. -Ice and elevation. -Pain medications needed. -Continue weightbearing as tolerated in the cam boot. Wear the cam boot at all times. -Monitor for  any clinical signs or symptoms of infection and directed to call the office immediately should any occur or go to the ER. -Follow-up in 1 week for likely suture removal or sooner if any problems arise. In the meantime, encouraged to call the office with any questions, concerns, change in symptoms.   Ovid Curd, DPM

## 2015-07-20 ENCOUNTER — Telehealth: Payer: Self-pay | Admitting: *Deleted

## 2015-07-20 NOTE — Progress Notes (Signed)
Surgery performed at [facility] for [facility] left foot.

## 2015-07-20 NOTE — Telephone Encounter (Signed)
"  I'm calling to confirm surgery date for her."  She had surgery on 07/13/2015.  "Okay, that's all I needed."

## 2015-07-20 NOTE — Telephone Encounter (Signed)
Give me a call regarding her disability.  I attempted to return her call regarding patient.  I left a message to call me back.

## 2015-07-21 ENCOUNTER — Telehealth: Payer: Self-pay | Admitting: *Deleted

## 2015-07-21 NOTE — Telephone Encounter (Signed)
"  I'm returning your call.  I'm calling to get surgery confirmation.  I need date of surgery and type of surgery she had."  I called and left a message that patient had an Maria Fleming of her left foot on 07/13/2015.

## 2015-07-26 ENCOUNTER — Encounter: Payer: Self-pay | Admitting: Podiatry

## 2015-07-26 ENCOUNTER — Ambulatory Visit (INDEPENDENT_AMBULATORY_CARE_PROVIDER_SITE_OTHER): Payer: BLUE CROSS/BLUE SHIELD | Admitting: Podiatry

## 2015-07-26 VITALS — BP 125/62 | HR 81 | Resp 18

## 2015-07-26 DIAGNOSIS — Z9889 Other specified postprocedural states: Secondary | ICD-10-CM

## 2015-07-26 DIAGNOSIS — M2012 Hallux valgus (acquired), left foot: Secondary | ICD-10-CM

## 2015-07-26 NOTE — Progress Notes (Signed)
Patient ID: Maria Fleming, female   DOB: [birth year], 34 y.o.   MRN: [id number]   DOS: 07/13/15 s/p Left Maria Fleming   Subjective: 34 year old female presents the office  today for follow-up evaluation of left foot bunion surgery. She is here for suture removal. She states that she is doing very well and she is only been taking ibuprofen for pain. She has remained in the CAM boot. No recent injury or trauma.  She denies any systemic complaints as fevers, chills, nausea, vomiting. Denies any calf pain, chest pain, shortness of breath. No other complaints at this time in no acute changes otherwise.  Objective: AAO 3, NAD, presents and cam boot Dressings clean, dry, intact. Neurovascular status intact and unchanged Incision the dorsal medial aspect the left foot as well coapted without any evidence of dehiscence and sutures are intact. There is no surrounding erythema, ascending cellulitis, fluctuance, crepitus, malodor, drainage/purulence. There is mild edema along the surgical site, although decreased. There is no tenderness palpation over the surgical site. First MTPJ range of motion is intact without any pain. Toes is in a rectus position. There is no pain of the right foot. No other open lesions or pre-ulcerative lesions. No pain with calf compression, swelling, warmth, erythema.  Assessment: 34 year old female 2 weeks status post left foot bunionectomy, doing well  Plan: -Treatment options discussed including all alternatives, risks, and complications - sutures were removed without complications. Antibiotic ointment was placed over the incisions followed by dry sterile dressing. Keep dressing clean, dry, intact.she can start the showers lungs incision remains coapted. At this problem the incision to hold off on showering call the office.  -Ice and elevation. -Pain medications needed. -Continue weightbearing as tolerated in the cam boot. Wear the cam boot at all times. -Monitor for any  clinical signs or symptoms of infection and directed to call the office immediately should any occur or go to the ER. -Follow-up in 2 weeks or sooner if any problems arise. In the meantime, encouraged to call the office with any questions, concerns, change in symptoms.  -X-rays appointment. We'll likely transition to a Darco shoe.   Ovid Curd, DPM

## 2015-08-01 ENCOUNTER — Telehealth: Payer: Self-pay | Admitting: *Deleted

## 2015-08-01 NOTE — Telephone Encounter (Addendum)
Pt states she has had an allergic reaction at the surgery site.  Pt states the assistant at the last visit accidentally put betadine on the top and bottom of the stitches.  Later pt had swelling and blisters that drained, pt states she put cortisone cream on the areas initially, but has since stopped.  Pt states the areas have drained, but look better now.  I told pt to hold off on putting anything else on the areas until instructed by Dr. Ardelle Anton.  Dr. Ardelle Anton states alternate using Neosporin and cortisone on the irritated sites.  Pt states understanding.

## 2015-08-09 ENCOUNTER — Encounter: Payer: Self-pay | Admitting: Podiatry

## 2015-08-09 ENCOUNTER — Ambulatory Visit (INDEPENDENT_AMBULATORY_CARE_PROVIDER_SITE_OTHER): Payer: BLUE CROSS/BLUE SHIELD

## 2015-08-09 ENCOUNTER — Ambulatory Visit (INDEPENDENT_AMBULATORY_CARE_PROVIDER_SITE_OTHER): Payer: BLUE CROSS/BLUE SHIELD | Admitting: Podiatry

## 2015-08-09 VITALS — BP 61/44 | HR 85 | Resp 18

## 2015-08-09 DIAGNOSIS — M2012 Hallux valgus (acquired), left foot: Secondary | ICD-10-CM | POA: Diagnosis not present

## 2015-08-09 DIAGNOSIS — Z9889 Other specified postprocedural states: Secondary | ICD-10-CM

## 2015-08-09 NOTE — Progress Notes (Signed)
Patient ID: Maria Fleming, female   DOB: 04-Sep-1981, 34 y.o.   MRN: [id number]  Subjective: 34 year old female presents the office today status post left foot Austin bunionectomy performed on 07/13/2015. She states that she is doing "very well". She denies any pain she is not taking any pain medication. She did call after last appointment stating that she had some blistering after an assistant accidentally used a small amount of betadine on the skin to remove the sutures. She has been applying and glottic ointment overlying the areas of blisters she states they're healing well. She denies any swelling redness or any right. Denies any drainage or purulence. No other complaints at this time. She denies any systemic complaints such as fevers, chills, nausea, vomiting. No calf pain, chest pain, shortness of breath.  Objective: AAO 3, NAD Neurovascular status intact and unchanged Incision the dorsal medial aspect the left foot as well coapted. There is some hyperpigmented skin around the incision from where previous blisters apparently were at. There is localized edema around the incision. There is no swelling erythema, ascending cellulitis, fluctuance, crepitus, malodor, drainage/purulence. There is no pain with MPJ range of motion. There is no restriction MPJ range of motion. Hallux sits in a rectus position. There is no pain to the left side and the scar is well-healed over the surgical site. No pain the left foot. There are no other open lesions or pre-ulcerative lesions identified bilaterally. There is no pain with calf compression, swelling, warmth, erythema.  Assessment: 34 year old female status post left Austin bunionectomy, doing well; blister reaction to Betadine left foot  Plan: -X-rays were obtained and reviewed with the patient.  -Treatment options discussed including all alternatives, risks, and complications -Antibiotic ointment was placed over the incision followed by dry sterile dressing.  She should continue with this as well. She can soak her foot in Epson salts if needed. Monitor for any clinical signs or symptoms of infection and directed to call the office immediately should any occur or go to the ER. Continue with CAM boot. She would like to return to work on Monday she's having no pain. She states that work she can sit and keep her foot propped up. I am okay with her going back to work on Monday as long as her pain is controlled. If there is any increase in pain or problems hold off on going back to work until further notice. -Continue with CAM boot. -Ice and elevation. -Follow-up in 2 weeks or sooner if any problems arise. In the meantime, encouraged to call the office with any questions, concerns, change in symptoms.  *x-ray next appointment   Ovid Curd, DPM

## 2015-08-23 ENCOUNTER — Ambulatory Visit (INDEPENDENT_AMBULATORY_CARE_PROVIDER_SITE_OTHER): Payer: BLUE CROSS/BLUE SHIELD

## 2015-08-23 ENCOUNTER — Encounter: Payer: Self-pay | Admitting: Podiatry

## 2015-08-23 ENCOUNTER — Ambulatory Visit (INDEPENDENT_AMBULATORY_CARE_PROVIDER_SITE_OTHER): Payer: BLUE CROSS/BLUE SHIELD | Admitting: Podiatry

## 2015-08-23 VITALS — BP 114/79 | HR 77 | Resp 18

## 2015-08-23 DIAGNOSIS — M2012 Hallux valgus (acquired), left foot: Secondary | ICD-10-CM | POA: Diagnosis not present

## 2015-08-23 DIAGNOSIS — Z9889 Other specified postprocedural states: Secondary | ICD-10-CM

## 2015-08-23 NOTE — Progress Notes (Signed)
Patient ID: Maria Fleming, female   DOB: 11/10/1980, 34 y.o.   MRN: [id number]  Subjective: 34 year old female presents the office today status post left foot Austin bunionectomy performed on 07/13/2015.  She states that she is doing well. She has noticed a little bloody drainage on the Band-Aid that she changes daily to her left foot. She denies any redness or red streaks. She denies any pus coming from the area. She denies any systemic complaints as fevers, chills, nausea, vomiting. No calf pain, chest pain, shortness of breath. She is continuing the CAM boot. Other complaints at this time in no acute changes.  Objective: AAO 3, NAD Neurovascular status intact and unchanged Incision the dorsal medial aspect the left foot as well coapted on the proximal aspect. There is a wound on the distal aspect the incision which appears to be superficial about 0.8 on admission length by 0.3 cm in width. The wound appears to be superficial with fibrotic tissue. This is over the area at the previous reaction from the Betadine reaction. There is no surrounding erythema, ascending saline disc, fluctuance, crepitus, malodor, drainage/purulence. There is mild edema along the surgical site. There is no tenderness on patient on the surgery site. No other areas of tenderness bilateral lower extremity. No other open lesions or pre-ulcerative lesions. There is no pain with calf compression, psych, warmth, erythema.   Assessment: 34 year old female status post left Austin bunionectomy, with superficial wound likely due to dehisence due to betadine reaction   Plan: -X-rays were obtained and reviewed with the patient.  There is increased consolidation across the osteotomy site. -Treatment options discussed including all alternatives, risks, and complications - the wound was debrided of all fibrotic tissue to healthy, granular wound base. Area was cleansed. Antibody ointment was placed followed by dry sterile dressing. Continue  with daily dressing changes. She can continue with Epsom salts soaks. Continue immobilization in a CAM boot with a surgical shoe. Continue with Darco bunion splint. - Ice and elevation. -Monitor for any clinical signs or symptoms of infection and directed to call the office immediately should any occur or go to the ER. -Follow-up in 10 days - 2 weeks or sooner if any problems arise. In the meantime, encouraged to call the office with any questions, concerns, change in symptoms.   Ovid CurdMatthew Tori Dattilio, DPM

## 2015-08-23 NOTE — Patient Instructions (Signed)
Monitor for any clinical signs or symptoms of infection and directed to call the office immediately should any occur or go to the ER.  

## 2015-09-06 ENCOUNTER — Ambulatory Visit (INDEPENDENT_AMBULATORY_CARE_PROVIDER_SITE_OTHER): Payer: BLUE CROSS/BLUE SHIELD

## 2015-09-06 ENCOUNTER — Ambulatory Visit (INDEPENDENT_AMBULATORY_CARE_PROVIDER_SITE_OTHER): Payer: BLUE CROSS/BLUE SHIELD | Admitting: Podiatry

## 2015-09-06 ENCOUNTER — Encounter: Payer: Self-pay | Admitting: Podiatry

## 2015-09-06 VITALS — BP 111/63

## 2015-09-06 DIAGNOSIS — Z9889 Other specified postprocedural states: Secondary | ICD-10-CM

## 2015-09-06 DIAGNOSIS — M2012 Hallux valgus (acquired), left foot: Secondary | ICD-10-CM

## 2015-09-06 NOTE — Progress Notes (Signed)
Patient ID: Maria Fleming, female   DOB: 07/20/1981, 34 y.o.   MRN: [id number]  Subjective: 34 year old female presents the office today status post left foot Austin bunionectomy performed on 07/13/2015.  She subsequently developed a small wound dehiscence on the distal aspect the left surgical site from the Betadine reaction. She has been continuing with Epson salt soaks daily but a small amount of antibiotic ointment overlying the incision every other day. She states the wound is healing nicely. She has no pain to the area. Denies any drainage or pus. Denies any returning redness or red streaks. Denies any pain to the area. She denies any systemic complaints as fevers, chills, nausea, vomiting. No calf pain, chest pain, shortness of breath. She is continuing the CAM boot. Other complaints at this time in no acute changes.  Objective: AAO 3, NAD Neurovascular status intact and unchanged Incision the dorsal medial aspect the left foot as well coapted on the proximal aspect. There is a wound on the distal aspect the incision which appears to be very superficial Melissa scab formed on the distal aspect of the incision. There is a very small opening measuring 0.3 x 0.1 cm and this greatly improved compared to last appointment. This area is very superficial and appears to be a scab start a formal overlying the wound. There is very mild edema along the surgical site. There is no surrounding erythema or increase in warmth. There is no drainage or purulence. There is no tenderness to palpation of the surgical site. There is no pain with first MTPJ range of motion and there is no restriction. No other areas of tenderness bilateral lower extremity. No other open lesions or pre-ulcerative lesions. There is no pain with calf compression, psych, warmth, erythema.   Assessment: 34 year old female status post left Austin bunionectomy, with superficial wound likely due to dehisence due to betadine reaction which is healing  and currently no signs of infection.   Plan: -X-rays were obtained and reviewed with the patient.  There is increased consolidation across the osteotomy site. -Treatment options discussed including all alternatives, risks, and complications -The wound appears to be almost healed this time the scab is forming. A small amount of antibiotic ointment was placed followed by dressing. Continue daily dressing changes. Continue with Epsom salts soaks. Monitor for any clinical signs or symptoms of infection and directed to call the office immediately should any occur or go to the ER. -I will follow-up with her and mixed 3-4 weeks. I encouraged her to call the office sooner if any problems are to arise. Call any questions or concerns in the meantime.  Ovid CurdMatthew Wagoner, DPM

## 2015-09-27 ENCOUNTER — Encounter: Payer: Self-pay | Admitting: Podiatry

## 2015-09-27 ENCOUNTER — Ambulatory Visit (INDEPENDENT_AMBULATORY_CARE_PROVIDER_SITE_OTHER): Payer: BLUE CROSS/BLUE SHIELD

## 2015-09-27 ENCOUNTER — Ambulatory Visit (INDEPENDENT_AMBULATORY_CARE_PROVIDER_SITE_OTHER): Payer: BLUE CROSS/BLUE SHIELD | Admitting: Podiatry

## 2015-09-27 VITALS — BP 115/73 | HR 82 | Resp 18

## 2015-09-27 DIAGNOSIS — Z9889 Other specified postprocedural states: Secondary | ICD-10-CM

## 2015-09-27 DIAGNOSIS — M2012 Hallux valgus (acquired), left foot: Secondary | ICD-10-CM

## 2015-09-27 NOTE — Progress Notes (Signed)
Patient ID: Maria Fleming, female   DOB: 05/14/1981, 34 y.o.   MRN: [id number]  Subjective: 34 year old female presents the office today status post left foot Austin bunionectomy performed on 07/13/2015.  She subsequently developed a small wound dehiscence on the distal aspect the left surgical site from the Betadine reaction. She has been continuing with Epson salt soaks daily but a small amount of antibiotic ointment overlying the incision every other day.  She does state she believes the wound has finally healed. Denies any drainage or pus. Denies any surrounding redness or red streaks. Denies any pain to the area. She denies any systemic complaints as fevers, chills, nausea, vomiting. No calf pain, chest pain, shortness of breath. She is continuing the CAM boot. Other complaints at this time in no acute changes.  Objective: AAO 3, NAD; presents in regular shoegear.  Neurovascular status intact and unchanged Incision the dorsal medial aspect the left foot as well coapted at this time and there is no open lesion. There is hypopigmentation of the skin over the scar.  There is no tenderness to palpation overlying the incision towards the surgical site. There is no pain with MPJ range of motion. Toe sits in a  Slight valgus position compared to contralateral extremity.There is no pain with first MTPJ range of motion and there is no restriction. No other areas of tenderness bilateral lower extremity. No other open lesions or pre-ulcerative lesions. There is no pain with calf compression, swelling, warmth, erythema.   Assessment: 34 year old female status post left Austin bunionectomy, with  Healed wound dehiscence.  Plan: -X-rays were obtained and reviewed with the patient.  There is increased consolidation across the osteotomy site. -Treatment options discussed including all alternatives, risks, and complications -The wound appears to be healed this time.  Recommended cocoa better vitamin E cream over  the scar daily. -Continue regular shoe gear. Range of motion exercises. -Follow-up as needed. Call any questions or concerns in the meantime.  Ovid CurdMatthew Carita Sollars, DPM

## 2016-02-09 LAB — OB RESULTS CONSOLE HIV ANTIBODY (ROUTINE TESTING): HIV: NONREACTIVE

## 2016-02-09 LAB — OB RESULTS CONSOLE RUBELLA ANTIBODY, IGM: RUBELLA: IMMUNE

## 2016-02-09 LAB — OB RESULTS CONSOLE GC/CHLAMYDIA
CHLAMYDIA, DNA PROBE: NEGATIVE
GC PROBE AMP, GENITAL: NEGATIVE

## 2016-02-09 LAB — OB RESULTS CONSOLE ANTIBODY SCREEN: Antibody Screen: NEGATIVE

## 2016-02-09 LAB — OB RESULTS CONSOLE ABO/RH: RH Type: POSITIVE

## 2016-02-09 LAB — OB RESULTS CONSOLE HEPATITIS B SURFACE ANTIGEN: Hepatitis B Surface Ag: NEGATIVE

## 2016-02-09 LAB — OB RESULTS CONSOLE RPR: RPR: NONREACTIVE

## 2016-08-09 LAB — OB RESULTS CONSOLE GBS: GBS: NEGATIVE

## 2016-08-31 ENCOUNTER — Telehealth (HOSPITAL_COMMUNITY): Payer: Self-pay | Admitting: *Deleted

## 2016-08-31 ENCOUNTER — Other Ambulatory Visit: Payer: Self-pay | Admitting: Obstetrics & Gynecology

## 2016-08-31 ENCOUNTER — Other Ambulatory Visit (HOSPITAL_COMMUNITY): Payer: Self-pay | Admitting: Obstetrics & Gynecology

## 2016-08-31 ENCOUNTER — Encounter (HOSPITAL_COMMUNITY): Payer: Self-pay | Admitting: *Deleted

## 2016-08-31 NOTE — Telephone Encounter (Signed)
Preadmission screen  

## 2016-08-31 NOTE — H&P (Signed)
Maria Fleming is a 35 y.o. female G1 presenting at 39.5 wks for labor IOL for marginal cord insertion. EDC 09/02/2016 Long standing infertility and endometriosis, failed several ovulation rounds. Now spontaneous pregnancy.  Marginal cord, otherwise uncomplicated pregnancy. Growth sono at 28 and 36 wks.  Excessive wt gain in pregnancy  OB History    Gravida Para Term Preterm AB Living   1             SAB TAB Ectopic Multiple Live Births                 Past Medical History:  Diagnosis Date  . Cervix abnormality    precancerous cells  . Endometriosis   . Enlarged thyroid   . Gastroenteritis    has been seen by specialist, unsure of official dx  . Hx of chlamydia infection   . No pertinent past medical history   . Ovarian cyst   . Vaginal Pap smear, abnormal    Past Surgical History:  Procedure Laterality Date  . BREAST LUMPECTOMY    . CYSTECTOMY W/ URETEROSIGMOIDOSTOMY  02/24/2015  . OVARIAN CYST REMOVAL  08/21/2012   Procedure: OVARIAN CYSTECTOMY;  Surgeon: Robley FriesVaishali R Aubrie Lucien, MD;  Location: [facility];  Service: Gynecology;  Laterality: Bilateral;  . ROBOTIC ASSISTED LAPAROSCOPIC LYSIS OF ADHESION N/A 02/24/2015   Procedure: ROBOTIC ASSISTED LAPAROSCOPIC LYSIS OF ADHESION/Ablation of Endometriosis; bilateral endometrioma cyst excision;  Surgeon: Shea EvansVaishali Walta [place], MD;  Location: [facility];  Service: Gynecology;  Laterality: N/A;  3 1/2 hrs.  . WISDOM TOOTH EXTRACTION     Family History: family history includes Cancer in her father; Diabetes in her mother; Hypertension in her mother. Social History:  reports that she has never smoked. She has never used smokeless tobacco. She reports that she drinks about 0.6 oz of alcohol per week . She reports that she does not use drugs.     Maternal Diabetes: No Genetic Screening: Declined. But AFP1 normal Maternal Ultrasounds/Referrals: Normal Fetal Ultrasounds or other Referrals:  None Maternal Substance Abuse:  No Significant Maternal Medications:   None Significant Maternal Lab Results:  Lab values include: Group B Strep negative Other Comments:  None  ROS neg History   There were no vitals taken for this visit. Exam Physical Exam  Physical exam:  A&O x 3, no acute distress. Pleasant HEENT neg, no thyromegaly Lungs CTA bilat CV RRR, S1S2 normal Abdo soft, non tender, non acute Extr no edema/ tenderness Pelvic  FHT   Toco  Prenatal labs: ABO, Rh: A/Positive/-- (04/13 0000) Antibody: Negative (04/13 0000) Rubella: Immune (04/13 0000) RPR: Nonreactive (04/13 0000)  HBsAg: Negative (04/13 0000)  HIV: Non-reactive (04/13 0000)  GBS: Negative (10/12 0000)   Assessment/Plan: 35 yo G1, 39.6 wks, admitted for labor IOL for marginal cord. Growth AGA, last sono at 36 wks noted 6'2"  Cytotec per protocol. Epidural ok in active labor. GBS(-). EFW 7.1/2 lbs  Maria Fleming R 08/31/2016, 10:38 PM

## 2016-09-01 ENCOUNTER — Inpatient Hospital Stay (HOSPITAL_COMMUNITY)
Admission: RE | Admit: 2016-09-01 | Discharge: 2016-09-01 | Disposition: A | Discharge status: Home / Self Care | Payer: BLUE CROSS/BLUE SHIELD | Source: Ambulatory Visit | Attending: Obstetrics & Gynecology | Admitting: Obstetrics & Gynecology

## 2016-09-01 ENCOUNTER — Inpatient Hospital Stay (HOSPITAL_COMMUNITY)
Admission: RE | Admit: 2016-09-01 | Discharge: 2016-09-05 | DRG: 765 | Disposition: A | Discharge status: Home / Self Care | Payer: BLUE CROSS/BLUE SHIELD | Source: Ambulatory Visit | Attending: Obstetrics & Gynecology | Admitting: Obstetrics & Gynecology

## 2016-09-01 ENCOUNTER — Encounter (HOSPITAL_COMMUNITY): Payer: Self-pay

## 2016-09-01 DIAGNOSIS — O43123 Velamentous insertion of umbilical cord, third trimester: Principal | ICD-10-CM | POA: Diagnosis present

## 2016-09-01 DIAGNOSIS — Z8249 Family history of ischemic heart disease and other diseases of the circulatory system: Secondary | ICD-10-CM | POA: Diagnosis not present

## 2016-09-01 DIAGNOSIS — O324XX0 Maternal care for high head at term, not applicable or unspecified: Secondary | ICD-10-CM | POA: Diagnosis present

## 2016-09-01 DIAGNOSIS — K429 Umbilical hernia without obstruction or gangrene: Secondary | ICD-10-CM | POA: Diagnosis not present

## 2016-09-01 DIAGNOSIS — O9081 Anemia of the puerperium: Secondary | ICD-10-CM | POA: Diagnosis not present

## 2016-09-01 DIAGNOSIS — R319 Hematuria, unspecified: Secondary | ICD-10-CM | POA: Diagnosis not present

## 2016-09-01 DIAGNOSIS — Z23 Encounter for immunization: Secondary | ICD-10-CM | POA: Diagnosis not present

## 2016-09-01 DIAGNOSIS — Z833 Family history of diabetes mellitus: Secondary | ICD-10-CM

## 2016-09-01 DIAGNOSIS — O43199 Other malformation of placenta, unspecified trimester: Secondary | ICD-10-CM | POA: Diagnosis present

## 2016-09-01 DIAGNOSIS — D62 Acute posthemorrhagic anemia: Secondary | ICD-10-CM | POA: Diagnosis not present

## 2016-09-01 DIAGNOSIS — Z3A40 40 weeks gestation of pregnancy: Secondary | ICD-10-CM | POA: Diagnosis not present

## 2016-09-01 DIAGNOSIS — Z412 Encounter for routine and ritual male circumcision: Secondary | ICD-10-CM | POA: Diagnosis not present

## 2016-09-01 DIAGNOSIS — O43193 Other malformation of placenta, third trimester: Secondary | ICD-10-CM | POA: Diagnosis not present

## 2016-09-01 DIAGNOSIS — Z3A39 39 weeks gestation of pregnancy: Secondary | ICD-10-CM

## 2016-09-01 LAB — CBC
HEMATOCRIT: 33 % — AB (ref 36.0–46.0)
Hemoglobin: 11.2 g/dL — ABNORMAL LOW (ref 12.0–15.0)
MCH: 29.7 pg (ref 26.0–34.0)
MCHC: 33.9 g/dL (ref 30.0–36.0)
MCV: 87.5 fL (ref 78.0–100.0)
PLATELETS: 261 10*3/uL (ref 150–400)
RBC: 3.77 MIL/uL — ABNORMAL LOW (ref 3.87–5.11)
RDW: 14 % (ref 11.5–15.5)
WBC: 9.3 10*3/uL (ref 4.0–10.5)

## 2016-09-01 LAB — TYPE AND SCREEN
ABO/RH(D): A POS
ANTIBODY SCREEN: NEGATIVE

## 2016-09-01 LAB — ABO/RH: ABO/RH(D): A POS

## 2016-09-01 LAB — RPR: RPR: NONREACTIVE

## 2016-09-01 MED ORDER — SOD CITRATE-CITRIC ACID 500-334 MG/5ML PO SOLN
30.0000 mL | ORAL | Status: DC | PRN
  Administered 2016-09-02: 30 mL via ORAL
  Filled 2016-09-01: qty 15

## 2016-09-01 MED ORDER — TERBUTALINE SULFATE 1 MG/ML IJ SOLN: 0.2500 mg | Freq: Once | INTRAMUSCULAR | Status: DC | PRN

## 2016-09-01 MED ORDER — ACETAMINOPHEN 325 MG PO TABS: 650.0000 mg | ORAL_TABLET | ORAL | Status: DC | PRN

## 2016-09-01 MED ORDER — LIDOCAINE HCL (PF) 1 % IJ SOLN: 30.0000 mL | INTRAMUSCULAR | Status: DC | PRN

## 2016-09-01 MED ORDER — OXYCODONE-ACETAMINOPHEN 5-325 MG PO TABS: 2.0000 | ORAL_TABLET | ORAL | Status: DC | PRN

## 2016-09-01 MED ORDER — ONDANSETRON HCL 4 MG/2ML IJ SOLN: 4.0000 mg | Freq: Four times a day (QID) | INTRAMUSCULAR | Status: DC | PRN

## 2016-09-01 MED ORDER — OXYTOCIN 40 UNITS IN LACTATED RINGERS INFUSION - SIMPLE MED
1.0000 m[IU]/min | INTRAVENOUS | Status: DC
  Administered 2016-09-01 – 2016-09-02 (×2): 2 m[IU]/min via INTRAVENOUS
  Administered 2016-09-02: 8 m[IU]/min via INTRAVENOUS

## 2016-09-01 MED ORDER — OXYCODONE-ACETAMINOPHEN 5-325 MG PO TABS: 1.0000 | ORAL_TABLET | ORAL | Status: DC | PRN

## 2016-09-01 MED ORDER — OXYTOCIN BOLUS FROM INFUSION: 500.0000 mL | Freq: Once | INTRAVENOUS | Status: DC

## 2016-09-01 MED ORDER — LACTATED RINGERS IV SOLN
500.0000 mL | INTRAVENOUS | Status: DC | PRN
  Administered 2016-09-02: 500 mL via INTRAVENOUS

## 2016-09-01 MED ORDER — MISOPROSTOL 25 MCG QUARTER TABLET
25.0000 ug | ORAL_TABLET | ORAL | Status: DC | PRN
  Administered 2016-09-01 (×2): 25 ug via VAGINAL
  Filled 2016-09-01 (×3): qty 0.25

## 2016-09-01 MED ORDER — OXYTOCIN 40 UNITS IN LACTATED RINGERS INFUSION - SIMPLE MED
2.5000 [IU]/h | INTRAVENOUS | Status: DC
  Filled 2016-09-01: qty 1000

## 2016-09-01 MED ORDER — ZOLPIDEM TARTRATE 5 MG PO TABS
5.0000 mg | ORAL_TABLET | Freq: Every evening | ORAL | Status: DC | PRN
  Administered 2016-09-02: 5 mg via ORAL
  Filled 2016-09-01: qty 1

## 2016-09-01 MED ORDER — LACTATED RINGERS IV SOLN
INTRAVENOUS | Status: DC
  Administered 2016-09-01 (×3): via INTRAVENOUS
  Administered 2016-09-02: 125 mL/h via INTRAVENOUS
  Administered 2016-09-02: 02:00:00 via INTRAVENOUS

## 2016-09-01 NOTE — Anesthesia Pain Management Evaluation Note (Signed)
  [facility]NA Pain Management Visit Note  Patient: Maria Fleming, 35 y.o., female  "Hello I am a [facility] of the anesthesia team at [facility]. We have an anesthesia team available at all times to provide care throughout the hospital, including epidural management and anesthesia for C-section. I don't know your plan for the delivery whether it a natural birth, water birth, IV sedation, nitrous supplementation, doula or epidural, but we want to meet your pain goals."   1.Was your pain managed to your expectations on prior hospitalizations?   No prior hospitalizations  2.What is your expectation for pain management during this hospitalization?     Epidural  3.How can we help you reach that goal? unsure  Record the patient's initial score and the patient's pain goal.   Pain: 0  Pain Goal: 8 [facility] wants you to be able to say your pain was always managed very well.  Cephus ShellingBURGER,Maria Fleming 09/01/2016

## 2016-09-01 NOTE — Progress Notes (Signed)
S/p Cytptec x 2. On pitocin since 10 am. Some pain with UCs but no cervical change or bloody show. No prior cervical surgery. Patient has been ambulating  FHT 150/ + accels/ no decels/ mod variability - cat I Toco- Regular 2-3 min SVE closed, very soft cx, stn -4.   Reviewed option to discharge home vs continue current plan. Do not recommend c/sectin for failed induction since IOL for marginal cord and FHT reactive.  Pt wants to continue labor IOL.  Stop pitocin for 2 hrs, ok for regular diet now and restart pitocin per protocol after that. Sit on birthing ball or ambulate.  Reassess progress.

## 2016-09-01 NOTE — Progress Notes (Signed)
Maria Fleming is a 35 y.o. G1P0 at 6992w6d by ultrasound admitted for induction of labor due to marginal cord.  Subjective: Feels some UCs, slept well  Objective: BP 122/66 (BP Location: Left Arm)   Pulse 69   Temp 98.2 F (36.8 C) (Oral)   Resp 18   Ht 5\' 6"  (1.676 m)   Wt 204 lb (92.5 kg)   BMI 32.93 kg/m   FHT:  FHR: 140s bpm, variability: moderate,  accelerations:  Present,  decelerations:  Absent UC:   irregular, every 3-5 minutes SVE:   Dilation: Closed Effacement (%): Thick Exam by:: P Rumley RN    Assessment / Plan: IOL for marginal cord. Shower, regular diet, recheck at 9.45 am and start pitocin per protocol.  Anticipating active labor soon.  Maria Fleming R 09/01/2016, 9:31 AM

## 2016-09-01 NOTE — H&P (Signed)
Maria Fleming is a 35 y.o. female G1 presenting at 39.5 wks for labor IOL for marginal cord insertion. EDC 09/02/2016 Long standing infertility and endometriosis, failed several ovulation rounds. Now spontaneous pregnancy.  Marginal cord, otherwise uncomplicated pregnancy. Growth sono at 28 and 36 wks.  Excessive wt gain in pregnancy          OB History    Gravida Para Term Preterm AB Living   1             SAB TAB Ectopic Multiple Live Births                     Past Medical History:  Diagnosis Date  . Cervix abnormality    precancerous cells  . Endometriosis   . Enlarged thyroid   . Gastroenteritis    has been seen by specialist, unsure of official dx  . Hx of chlamydia infection   . No pertinent past medical history   . Ovarian cyst   . Vaginal Pap smear, abnormal         Past Surgical History:  Procedure Laterality Date  . BREAST LUMPECTOMY    . CYSTECTOMY W/ URETEROSIGMOIDOSTOMY  02/24/2015  . OVARIAN CYST REMOVAL  08/21/2012   Procedure: OVARIAN CYSTECTOMY;  Surgeon: Maria FriesVaishali Fleming Maria Holle, MD;  Location: [facility];  Service: Gynecology;  Laterality: Bilateral;  . ROBOTIC ASSISTED LAPAROSCOPIC LYSIS OF ADHESION N/A 02/24/2015   Procedure: ROBOTIC ASSISTED LAPAROSCOPIC LYSIS OF ADHESION/Ablation of Endometriosis; bilateral endometrioma cyst excision;  Surgeon: Maria EvansVaishali Saarah Dewing, MD;  Location: [facility];  Service: Gynecology;  Laterality: N/A;  3 1/2 hrs.  . WISDOM TOOTH EXTRACTION     Family History: family history includes Cancer in her father; Diabetes in her mother; Hypertension in her mother. Social History:  reports that she has never smoked. She has never used smokeless tobacco. She reports that she drinks about 0.6 oz of alcohol per week . She reports that she does not use drugs.     Maternal Diabetes: No Genetic Screening: Declined. But AFP1 normal Maternal Ultrasounds/Referrals: Normal Fetal Ultrasounds or other Referrals:  None Maternal Substance  Abuse:  No Significant Maternal Medications:  None Significant Maternal Lab Results:  Lab values include: Group B Strep negative Other Comments:  None  ROS neg   Physical exam:  BP 122/66 (BP Location: Left Arm)   Pulse 69   Temp 98.2 F (36.8 C) (Oral)   Resp 18   Ht 5\' 6"  (1.676 m)   Wt 204 lb (92.5 kg)   BMI 32.93 kg/m   A&O x 3, no acute distress. Pleasant HEENT neg, no thyromegaly Lungs CTA bilat CV RRR, S1S2 normal Abdo soft, non tender, non acute Extr no edema/ tenderness Pelvic closed, long, high station VTX FHT  120s/ category I Toco none   Prenatal labs: ABO, Rh: A/Positive/-- (04/13 0000) Antibody: Negative (04/13 0000) Rubella: Immune (04/13 0000) RPR: Nonreactive (04/13 0000)  HBsAg: Negative (04/13 0000)  HIV: Non-reactive (04/13 0000)  GBS: Negative (10/12 0000)   Assessment/Plan: 35 yo G1, 39.6 wks, admitted for labor IOL for marginal cord. Growth AGA, last sono at 36 wks noted 6'2"  Cytotec per protocol. Epidural ok in active labor. GBS(-). EFW 7.1/2 lbs  Maria FriesMODY,Maria Mitzel R, MD

## 2016-09-01 NOTE — Progress Notes (Signed)
S/p Cytptec x 2. Ate breakfast and showered this AM, contractions are less noticeable since.  FHT cat I Toco-irreg rare SVE closed, very soft cx, stn -4.   Pitocin per protocol, sit on birthing ball or ambulate.

## 2016-09-02 ENCOUNTER — Encounter (HOSPITAL_COMMUNITY): Payer: Self-pay | Admitting: *Deleted

## 2016-09-02 ENCOUNTER — Encounter (HOSPITAL_COMMUNITY)
Admission: RE | Disposition: A | Discharge status: Home / Self Care | Payer: Self-pay | Source: Ambulatory Visit | Attending: Obstetrics & Gynecology

## 2016-09-02 ENCOUNTER — Inpatient Hospital Stay (HOSPITAL_COMMUNITY): Payer: BLUE CROSS/BLUE SHIELD | Admitting: Anesthesiology

## 2016-09-02 SURGERY — Surgical Case
Anesthesia: Epidural

## 2016-09-02 MED ORDER — DIPHENHYDRAMINE HCL 25 MG PO CAPS
25.0000 mg | ORAL_CAPSULE | ORAL | Status: DC | PRN
  Filled 2016-09-02: qty 1

## 2016-09-02 MED ORDER — KETOROLAC TROMETHAMINE 30 MG/ML IJ SOLN: 30.0000 mg | Freq: Four times a day (QID) | INTRAMUSCULAR | Status: AC | PRN

## 2016-09-02 MED ORDER — NALBUPHINE HCL 10 MG/ML IJ SOLN: 5.0000 mg | INTRAMUSCULAR | Status: DC | PRN

## 2016-09-02 MED ORDER — ACETAMINOPHEN 500 MG PO TABS
1000.0000 mg | ORAL_TABLET | Freq: Four times a day (QID) | ORAL | Status: AC
  Administered 2016-09-02 – 2016-09-03 (×3): 1000 mg via ORAL
  Filled 2016-09-02 (×3): qty 2

## 2016-09-02 MED ORDER — LIDOCAINE HCL (PF) 1 % IJ SOLN
INTRAMUSCULAR | Status: DC | PRN
  Administered 2016-09-02 (×2): 5 mL via EPIDURAL

## 2016-09-02 MED ORDER — LACTATED RINGERS IV SOLN
INTRAVENOUS | Status: DC | PRN
  Administered 2016-09-02: 15:00:00 via INTRAVENOUS

## 2016-09-02 MED ORDER — ONDANSETRON HCL 4 MG/2ML IJ SOLN
INTRAMUSCULAR | Status: AC
  Filled 2016-09-02: qty 2

## 2016-09-02 MED ORDER — NALBUPHINE HCL 10 MG/ML IJ SOLN
5.0000 mg | INTRAMUSCULAR | Status: DC | PRN
  Administered 2016-09-02: 5 mg via INTRAVENOUS
  Filled 2016-09-02: qty 1

## 2016-09-02 MED ORDER — TETANUS-DIPHTH-ACELL PERTUSSIS 5-2.5-18.5 LF-MCG/0.5 IM SUSP: 0.5000 mL | Freq: Once | INTRAMUSCULAR | Status: DC

## 2016-09-02 MED ORDER — FENTANYL 2.5 MCG/ML BUPIVACAINE 1/10 % EPIDURAL INFUSION (WH - ANES)
14.0000 mL/h | INTRAMUSCULAR | Status: DC | PRN
  Administered 2016-09-02 (×2): 14 mL/h via EPIDURAL
  Filled 2016-09-02: qty 100

## 2016-09-02 MED ORDER — EPHEDRINE 5 MG/ML INJ: 10.0000 mg | INTRAVENOUS | Status: DC | PRN

## 2016-09-02 MED ORDER — CEFAZOLIN SODIUM-DEXTROSE 2-4 GM/100ML-% IV SOLN
INTRAVENOUS | Status: AC
  Filled 2016-09-02: qty 100

## 2016-09-02 MED ORDER — PRENATAL MULTIVITAMIN CH
1.0000 | ORAL_TABLET | Freq: Every day | ORAL | Status: DC
  Administered 2016-09-03 – 2016-09-05 (×3): 1 via ORAL
  Filled 2016-09-02 (×3): qty 1

## 2016-09-02 MED ORDER — OXYCODONE HCL 5 MG PO TABS
5.0000 mg | ORAL_TABLET | ORAL | Status: DC | PRN
  Administered 2016-09-04 – 2016-09-05 (×4): 5 mg via ORAL
  Filled 2016-09-02 (×3): qty 1

## 2016-09-02 MED ORDER — FENTANYL 2.5 MCG/ML BUPIVACAINE 1/10 % EPIDURAL INFUSION (WH - ANES)
INTRAMUSCULAR | Status: AC
  Filled 2016-09-02: qty 100

## 2016-09-02 MED ORDER — SODIUM CHLORIDE 0.9 % IR SOLN
Status: DC | PRN
  Administered 2016-09-02: 1000 mL

## 2016-09-02 MED ORDER — PHENYLEPHRINE 40 MCG/ML (10ML) SYRINGE FOR IV PUSH (FOR BLOOD PRESSURE SUPPORT)
80.0000 ug | PREFILLED_SYRINGE | INTRAVENOUS | Status: DC | PRN
  Administered 2016-09-02: 80 ug via INTRAVENOUS

## 2016-09-02 MED ORDER — KETOROLAC TROMETHAMINE 30 MG/ML IJ SOLN
30.0000 mg | Freq: Four times a day (QID) | INTRAMUSCULAR | Status: AC | PRN
  Administered 2016-09-02: 30 mg via INTRAVENOUS
  Filled 2016-09-02: qty 1

## 2016-09-02 MED ORDER — MEPERIDINE HCL 25 MG/ML IJ SOLN: 6.2500 mg | INTRAMUSCULAR | Status: DC | PRN

## 2016-09-02 MED ORDER — NALOXONE HCL 2 MG/2ML IJ SOSY
1.0000 ug/kg/h | PREFILLED_SYRINGE | INTRAVENOUS | Status: DC | PRN
  Filled 2016-09-02: qty 2

## 2016-09-02 MED ORDER — FENTANYL CITRATE (PF) 100 MCG/2ML IJ SOLN: 25.0000 ug | INTRAMUSCULAR | Status: DC | PRN

## 2016-09-02 MED ORDER — OXYCODONE HCL 5 MG PO TABS
5.0000 mg | ORAL_TABLET | Freq: Once | ORAL | Status: DC
  Filled 2016-09-02: qty 1

## 2016-09-02 MED ORDER — NALBUPHINE HCL 10 MG/ML IJ SOLN: 5.0000 mg | Freq: Once | INTRAMUSCULAR | Status: DC | PRN

## 2016-09-02 MED ORDER — ACETAMINOPHEN 500 MG PO TABS: 1000.0000 mg | ORAL_TABLET | Freq: Four times a day (QID) | ORAL | Status: DC

## 2016-09-02 MED ORDER — OXYTOCIN 10 UNIT/ML IJ SOLN
INTRAVENOUS | Status: DC | PRN
  Administered 2016-09-02: 40 [IU] via INTRAVENOUS

## 2016-09-02 MED ORDER — NALOXONE HCL 0.4 MG/ML IJ SOLN: 0.4000 mg | INTRAMUSCULAR | Status: DC | PRN

## 2016-09-02 MED ORDER — OXYTOCIN 40 UNITS IN LACTATED RINGERS INFUSION - SIMPLE MED: 2.5000 [IU]/h | INTRAVENOUS | Status: AC

## 2016-09-02 MED ORDER — KETOROLAC TROMETHAMINE 30 MG/ML IJ SOLN
INTRAMUSCULAR | Status: AC
  Filled 2016-09-02: qty 1

## 2016-09-02 MED ORDER — MORPHINE SULFATE (PF) 0.5 MG/ML IJ SOLN
INTRAMUSCULAR | Status: AC
  Filled 2016-09-02: qty 10

## 2016-09-02 MED ORDER — DIPHENHYDRAMINE HCL 50 MG/ML IJ SOLN: 12.5000 mg | INTRAMUSCULAR | Status: DC | PRN

## 2016-09-02 MED ORDER — SODIUM CHLORIDE 0.9% FLUSH: 3.0000 mL | INTRAVENOUS | Status: DC | PRN

## 2016-09-02 MED ORDER — KETOROLAC TROMETHAMINE 30 MG/ML IJ SOLN
30.0000 mg | Freq: Four times a day (QID) | INTRAMUSCULAR | Status: DC | PRN
  Administered 2016-09-02: 30 mg via INTRAMUSCULAR

## 2016-09-02 MED ORDER — MORPHINE SULFATE (PF) 0.5 MG/ML IJ SOLN
INTRAMUSCULAR | Status: DC | PRN
  Administered 2016-09-02: 3 mg via EPIDURAL

## 2016-09-02 MED ORDER — KETOROLAC TROMETHAMINE 30 MG/ML IJ SOLN: 30.0000 mg | Freq: Four times a day (QID) | INTRAMUSCULAR | Status: DC | PRN

## 2016-09-02 MED ORDER — WITCH HAZEL-GLYCERIN EX PADS: 1.0000 "application " | MEDICATED_PAD | CUTANEOUS | Status: DC | PRN

## 2016-09-02 MED ORDER — ONDANSETRON HCL 4 MG/2ML IJ SOLN: 4.0000 mg | Freq: Three times a day (TID) | INTRAMUSCULAR | Status: DC | PRN

## 2016-09-02 MED ORDER — LACTATED RINGERS IV SOLN
500.0000 mL | Freq: Once | INTRAVENOUS | Status: AC
  Administered 2016-09-02: 03:00:00 via INTRAVENOUS

## 2016-09-02 MED ORDER — SENNOSIDES-DOCUSATE SODIUM 8.6-50 MG PO TABS
2.0000 | ORAL_TABLET | ORAL | Status: DC
  Administered 2016-09-02 – 2016-09-04 (×3): 2 via ORAL
  Filled 2016-09-02 (×3): qty 2

## 2016-09-02 MED ORDER — PHENYLEPHRINE 40 MCG/ML (10ML) SYRINGE FOR IV PUSH (FOR BLOOD PRESSURE SUPPORT)
PREFILLED_SYRINGE | INTRAVENOUS | Status: AC
  Filled 2016-09-02: qty 10

## 2016-09-02 MED ORDER — ZOLPIDEM TARTRATE 5 MG PO TABS: 5.0000 mg | ORAL_TABLET | Freq: Every evening | ORAL | Status: DC | PRN

## 2016-09-02 MED ORDER — ACETAMINOPHEN 325 MG PO TABS
650.0000 mg | ORAL_TABLET | ORAL | Status: DC | PRN
  Administered 2016-09-03 – 2016-09-05 (×3): 650 mg via ORAL
  Filled 2016-09-02 (×4): qty 2

## 2016-09-02 MED ORDER — IBUPROFEN 600 MG PO TABS
600.0000 mg | ORAL_TABLET | Freq: Four times a day (QID) | ORAL | Status: DC
  Administered 2016-09-03 – 2016-09-05 (×10): 600 mg via ORAL
  Filled 2016-09-02 (×10): qty 1

## 2016-09-02 MED ORDER — PHENYLEPHRINE 40 MCG/ML (10ML) SYRINGE FOR IV PUSH (FOR BLOOD PRESSURE SUPPORT): 80.0000 ug | PREFILLED_SYRINGE | INTRAVENOUS | Status: DC | PRN

## 2016-09-02 MED ORDER — ONDANSETRON HCL 4 MG/2ML IJ SOLN
INTRAMUSCULAR | Status: DC | PRN
  Administered 2016-09-02: 4 mg via INTRAVENOUS

## 2016-09-02 MED ORDER — SIMETHICONE 80 MG PO CHEW: 80.0000 mg | CHEWABLE_TABLET | ORAL | Status: DC | PRN

## 2016-09-02 MED ORDER — LACTATED RINGERS IV SOLN
INTRAVENOUS | Status: DC | PRN
  Administered 2016-09-02 (×3): via INTRAVENOUS

## 2016-09-02 MED ORDER — DIPHENHYDRAMINE HCL 25 MG PO CAPS
25.0000 mg | ORAL_CAPSULE | ORAL | Status: DC | PRN
  Administered 2016-09-04: 25 mg via ORAL

## 2016-09-02 MED ORDER — DIPHENHYDRAMINE HCL 25 MG PO CAPS
25.0000 mg | ORAL_CAPSULE | Freq: Four times a day (QID) | ORAL | Status: DC | PRN
  Filled 2016-09-02: qty 1

## 2016-09-02 MED ORDER — LACTATED RINGERS IV SOLN
INTRAVENOUS | Status: DC
  Administered 2016-09-02 – 2016-09-03 (×3): via INTRAVENOUS

## 2016-09-02 MED ORDER — CEFAZOLIN SODIUM-DEXTROSE 2-4 GM/100ML-% IV SOLN
2.0000 g | Freq: Once | INTRAVENOUS | Status: AC
  Administered 2016-09-02: 2 g via INTRAVENOUS

## 2016-09-02 MED ORDER — MENTHOL 3 MG MT LOZG: 1.0000 | LOZENGE | OROMUCOSAL | Status: DC | PRN

## 2016-09-02 MED ORDER — EPHEDRINE 5 MG/ML INJ
10.0000 mg | INTRAVENOUS | Status: DC | PRN
Start: 2016-09-02 — End: 2016-09-02

## 2016-09-02 MED ORDER — DIBUCAINE 1 % RE OINT: 1.0000 "application " | TOPICAL_OINTMENT | RECTAL | Status: DC | PRN

## 2016-09-02 MED ORDER — COCONUT OIL OIL
1.0000 | TOPICAL_OIL | Status: DC | PRN
Start: 2016-09-02 — End: 2016-09-05
  Administered 2016-09-05: 1 via TOPICAL
  Filled 2016-09-02: qty 120

## 2016-09-02 MED ORDER — OXYTOCIN 10 UNIT/ML IJ SOLN
INTRAMUSCULAR | Status: AC
  Filled 2016-09-02: qty 4

## 2016-09-02 MED ORDER — SIMETHICONE 80 MG PO CHEW
80.0000 mg | CHEWABLE_TABLET | Freq: Three times a day (TID) | ORAL | Status: DC
  Administered 2016-09-03 – 2016-09-05 (×6): 80 mg via ORAL
  Filled 2016-09-02 (×7): qty 1

## 2016-09-02 MED ORDER — LIDOCAINE-EPINEPHRINE (PF) 2 %-1:200000 IJ SOLN
INTRAMUSCULAR | Status: DC | PRN
  Administered 2016-09-02: 3 mL via INTRADERMAL
  Administered 2016-09-02: 10 mL via INTRADERMAL

## 2016-09-02 MED ORDER — SIMETHICONE 80 MG PO CHEW
80.0000 mg | CHEWABLE_TABLET | ORAL | Status: DC
  Administered 2016-09-02 – 2016-09-04 (×3): 80 mg via ORAL
  Filled 2016-09-02 (×3): qty 1

## 2016-09-02 MED ORDER — PROMETHAZINE HCL 25 MG/ML IJ SOLN: 6.2500 mg | INTRAMUSCULAR | Status: DC | PRN

## 2016-09-02 SURGICAL SUPPLY — 37 items
APL SKNCLS STERI-STRIP NONHPOA (GAUZE/BANDAGES/DRESSINGS) ×1
BENZOIN TINCTURE PRP APPL 2/3 (GAUZE/BANDAGES/DRESSINGS) ×1 IMPLANT
CHLORAPREP W/TINT 26ML (MISCELLANEOUS) ×2 IMPLANT
CLAMP CORD UMBIL (MISCELLANEOUS) IMPLANT
CLOTH BEACON ORANGE TIMEOUT ST (SAFETY) ×2 IMPLANT
CONTAINER PREFILL 10% NBF 15ML (MISCELLANEOUS) IMPLANT
DRSG OPSITE POSTOP 4X10 (GAUZE/BANDAGES/DRESSINGS) ×2 IMPLANT
ELECT REM PT RETURN 9FT ADLT (ELECTROSURGICAL) ×2
ELECTRODE REM PT RTRN 9FT ADLT (ELECTROSURGICAL) ×1 IMPLANT
EXTRACTOR VACUUM KIWI (MISCELLANEOUS) IMPLANT
EXTRACTOR VACUUM M CUP 4 TUBE (SUCTIONS) IMPLANT
GLOVE BIO SURGEON STRL SZ7 (GLOVE) ×2 IMPLANT
GLOVE BIOGEL PI IND STRL 7.0 (GLOVE) ×2 IMPLANT
GLOVE BIOGEL PI INDICATOR 7.0 (GLOVE) ×2
GOWN STRL REUS W/TWL LRG LVL3 (GOWN DISPOSABLE) ×4 IMPLANT
KIT ABG SYR 3ML LUER SLIP (SYRINGE) IMPLANT
NDL HYPO 25X5/8 SAFETYGLIDE (NEEDLE) IMPLANT
NEEDLE HYPO 25X5/8 SAFETYGLIDE (NEEDLE) IMPLANT
NS IRRIG 1000ML POUR BTL (IV SOLUTION) ×2 IMPLANT
PACK C SECTION WH (CUSTOM PROCEDURE TRAY) ×2 IMPLANT
PAD OB MATERNITY 4.3X12.25 (PERSONAL CARE ITEMS) ×2 IMPLANT
RTRCTR C-SECT PINK 25CM LRG (MISCELLANEOUS) IMPLANT
STRIP CLOSURE SKIN 1/2X4 (GAUZE/BANDAGES/DRESSINGS) IMPLANT
STRIP CLOSURE SKIN 1/4X4 (GAUZE/BANDAGES/DRESSINGS) ×1 IMPLANT
SUT MON AB-0 CT1 36 (SUTURE) ×6 IMPLANT
SUT PLAIN 0 NONE (SUTURE) IMPLANT
SUT PLAIN 2 0 (SUTURE)
SUT PLAIN ABS 2-0 CT1 27XMFL (SUTURE) IMPLANT
SUT PROLENE 1 CT (SUTURE) ×1 IMPLANT
SUT VIC AB 0 CT1 27 (SUTURE) ×4
SUT VIC AB 0 CT1 27XBRD ANBCTR (SUTURE) ×2 IMPLANT
SUT VIC AB 2-0 CT1 27 (SUTURE) ×6
SUT VIC AB 2-0 CT1 TAPERPNT 27 (SUTURE) ×3 IMPLANT
SUT VIC AB 4-0 KS 27 (SUTURE) ×1 IMPLANT
SUT VICRYL 0 TIES 12 18 (SUTURE) IMPLANT
TOWEL OR 17X24 6PK STRL BLUE (TOWEL DISPOSABLE) ×2 IMPLANT
TRAY FOLEY CATH SILVER 14FR (SET/KITS/TRAYS/PACK) IMPLANT

## 2016-09-02 NOTE — Anesthesia Procedure Notes (Signed)
Epidural Patient location during procedure: OB Start time: 09/02/2016 3:04 AM End time: 09/02/2016 3:21 AM  Staffing Anesthesiologist: Heather RobertsSINGER, Jaquil Todt Performed: anesthesiologist   Preanesthetic Checklist Completed: patient identified, site marked, pre-op evaluation, timeout performed, IV checked, risks and benefits discussed and monitors and equipment checked  Epidural Patient position: sitting Prep: DuraPrep Patient monitoring: heart rate, cardiac monitor, continuous pulse ox and blood pressure Approach: midline Location: L2-L3 Injection technique: LOR saline  Needle:  Needle type: Tuohy  Needle gauge: 17 G Needle length: 9 cm Needle insertion depth: 7 cm Catheter size: 20 Guage Catheter at skin depth: 12 cm Test dose: negative and Other  Assessment Events: blood not aspirated, injection not painful, no injection resistance and negative IV test  Additional Notes Informed consent obtained prior to proceeding including risk of failure, 1% risk of PDPH, risk of minor discomfort and bruising.  Discussed rare but serious complications including epidural abscess, permanent nerve injury, epidural hematoma.  Discussed alternatives to epidural analgesia and patient desires to proceed.  Timeout performed pre-procedure verifying patient name, procedure, and platelet count.  Patient tolerated procedure well.

## 2016-09-02 NOTE — Transfer of Care (Signed)
Immediate Anesthesia Transfer of Care Note  Patient: Maria Fleming  Procedure(s) Performed: Procedure(s): CESAREAN SECTION (N/A)  Patient Location: PACU  Anesthesia Type:Epidural  Level of Consciousness: awake and alert   Airway & Oxygen Therapy: Patient Spontanous Breathing  Post-op Assessment: Report given to RN  Post vital signs: Reviewed  Last Vitals:  Vitals:   09/02/16 1451 09/02/16 1611  BP: 123/86 131/88  Pulse: 88 (!) 101  Resp: 20 (P) 20  Temp:  (P) 37.2 C    Last Pain:  Vitals:   09/02/16 1611  TempSrc: (P) Oral  PainSc:       Patients Stated Pain Goal: 7 (09/01/16 0411)  Complications: No apparent anesthesia complications

## 2016-09-02 NOTE — Progress Notes (Signed)
Spoke w RN after FHT noted deep variable decels f/by late decels. Reported SROM earlier, clear fluid and 3 cm dilatation and -3 station, head well applied, denies feeling cord at cx.  UCs were q 1-472min, so considering SROM and decels, plans to HOLD pitocin now, give fluid bolus, turn to side, give O2 mask.  Since then FHT back to normal. Category I, 150s/ + accels/ decels resolved/ mod variability, FHT back to her baseline. Contnue to observe since she has spontaneous UCs.

## 2016-09-02 NOTE — Anesthesia Postprocedure Evaluation (Signed)
Anesthesia Post Note  Patient: Maria Fleming  Procedure(s) Performed: Procedure(s) (LRB): CESAREAN SECTION (N/A)  Patient location during evaluation: PACU Anesthesia Type: Epidural Level of consciousness: awake, awake and alert and oriented Pain management: pain level controlled Vital Signs Assessment: post-procedure vital signs reviewed and stable Respiratory status: spontaneous breathing, nonlabored ventilation and respiratory function stable Cardiovascular status: stable Postop Assessment: no headache, no backache, epidural receding, patient able to bend at knees and no signs of nausea or vomiting Anesthetic complications: no     Last Vitals:  Vitals:   09/02/16 1700 09/02/16 1717  BP: (!) 125/59 (!) 118/49  Pulse: 98 79  Resp: 16 20  Temp:  37.5 C    Last Pain:  Vitals:   09/02/16 1717  TempSrc: Axillary  PainSc:    Pain Goal: Patients Stated Pain Goal: 3 (09/02/16 1715)               Cecile HearingStephen Edward Turk

## 2016-09-02 NOTE — Progress Notes (Addendum)
Maria Fleming is a 35 y.o. G1P0 at 5768w0d IOL for marginal cord.  Subjective: No complaints, epidural working    Objective: BP 131/85   Pulse 95   Temp 98.3 F (36.8 C) (Oral)   Resp 16   Ht 5\' 6"  (1.676 m)   Wt 204 lb (92.5 kg)   SpO2 99%   BMI 32.93 kg/m  Hematuria noted FHT:  FHR: 140s bpm, variability: moderate,  accelerations:  Present,  decelerations:  Absent UC:   regular, every 2-3 minutes, on pitocin, some coupling noted SVE:  8.5- 9 /90%/ swelling of cervix. Stn -2 (same since 2 hrs) Arrest of dilatation and descent.   Assessment / Plan: Arrest of active labor at 9 cm. No change in station from -2. Hematuria noted. FHT- category I Stop pitocin. Proceed with C/section.  Risks/complications of surgery reviewed incl infection, bleeding, damage to internal organs including bladder, bowels, ureters, blood vessels, other risks from anesthesia, VTE and delayed complications of any surgery, complications in future surgery reviewed. Also discussed neonatal complications incl difficult delivery, laceration, vacuum assistance, TTN etc. Pt understands and agrees, all concerns addressed.     Kannon Granderson R 09/02/2016, 2:45 PM

## 2016-09-02 NOTE — Addendum Note (Signed)
Addendum  created 09/02/16 1823 by Cecile HearingStephen Edward Lelaina Oatis, MD   Order list changed, Order sets accessed

## 2016-09-02 NOTE — Progress Notes (Signed)
Maria Fleming is a 35 y.o. G1P0 at 3030w0d IOL for marginal cord, long latent phase, now in active labor  Subjective: No complaints, epidural working   Objective: BP 104/75   Pulse (!) 130   Temp 98.4 F (36.9 C) (Oral)   Resp 15   Ht 5\' 6"  (1.676 m)   Wt 204 lb (92.5 kg)   SpO2 99%   BMI 32.93 kg/m  I/O last 3 completed shifts: In: -  Out: 325 [Urine:325]  FHT:  FHR: 140s bpm, variability: moderate,  accelerations:  Present,  decelerations:  Absent UC:   regular, every 2-3 minutes, on pitocin, some coupling noted SVE:   Dilation: 7.5 Effacement (%): 70 Station: -2 Exam by:: Dr Juliene PinaMody High station and caput noted. Cervix feels swollen and is unchanged since 10 am (1.1/2 hrs, will recheck at 1 pm)  Assessment / Plan: Active labor but seem protracting now. Continue to assess, possible CPD. FHT- category I.   Maria Fleming 09/02/2016, 12:01 PM

## 2016-09-02 NOTE — Anesthesia Preprocedure Evaluation (Signed)
Anesthesia Evaluation  Patient identified by MRN, date of birth, ID band Patient awake    Reviewed: Allergy & Precautions, NPO status , Patient's Chart, lab work & pertinent test results  History of Anesthesia Complications Negative for: history of anesthetic complications  Airway Mallampati: II  TM Distance: >3 FB Neck ROM: Full    Dental no notable dental hx. (+) Dental Advisory Given   Pulmonary neg pulmonary ROS,    Pulmonary exam normal breath sounds clear to auscultation       Cardiovascular negative cardio ROS Normal cardiovascular exam Rhythm:Regular Rate:Normal     Neuro/Psych negative neurological ROS  negative psych ROS   GI/Hepatic Neg liver ROS, Recurrent gastroenteritis   Endo/Other  negative endocrine ROS  Renal/GU negative Renal ROS  negative genitourinary   Musculoskeletal negative musculoskeletal ROS (+)   Abdominal   Peds negative pediatric ROS (+)  Hematology negative hematology ROS (+)   Anesthesia Other Findings   Reproductive/Obstetrics (+) Pregnancy                             Anesthesia Physical  Anesthesia Plan  ASA: II  Anesthesia Plan: Epidural   Post-op Pain Management:    Induction: Intravenous  Airway Management Planned: Natural Airway  Additional Equipment:   Intra-op Plan:   Post-operative Plan: Extubation in OR  Informed Consent: I have reviewed the patients History and Physical, chart, labs and discussed the procedure including the risks, benefits and alternatives for the proposed anesthesia with the patient or authorized representative who has indicated his/her understanding and acceptance.   Dental advisory given  Plan Discussed with: Anesthesiologist  Anesthesia Plan Comments:         Anesthesia Quick Evaluation

## 2016-09-02 NOTE — Op Note (Signed)
Cesarean Section Procedure Note Maria Fleming  09/01/2016 - 09/02/2016  Indications: Dystocia  Labor induction for marginal cord, 40 wks. Arrest if dilatation at 8-9 cm for 3 hours and arrest of descent at -2 station  Procedure: Primary Low Transverse Cesarean section  Pre-operative Diagnosis: 40 wks, Marginal cord. Arrest of dilatation and descent   Post-operative Diagnosis: Same   Surgeon: Shea EvansVaishali Keimon Basaldua, MD   Assistants: Arlan Organaniela Paul, CNM  Anesthesia: epidural   Procedure Details:  35 yo female G1, underwent labor IOL at 39.6 wks for marginal cord insertion and had 2 doses of Cytotec followed by pitocin, long latent labor and then rapidly progressed from 3 to 7-8 cm followed by protracted labor with arrest of dilatation at 8-9 cm and station at -2, hematuria and caput. [name] was advised C-section delivery and agreed. The risks, benefits, complications, treatment options, and expected outcomes were discussed with the patient. The patient concurred with the proposed plan, giving informed consent. identified as Maria Fleming and the procedure verified as C-Section Delivery. In Operating Room. A Time Out was held and the above information confirmed. 2 gm Ancef given.  After epidural anesthesia was noted to be adequate,  the patient was draped and prepped in the usual sterile manner. A Pfannenstiel incision was made and carried down through the subcutaneous tissue to the fascia. Fascial incision was made and extended transversely. The fascia was separated from the underlying rectus tissue superiorly and inferiorly. The peritoneum was identified and entered. Peritoneal incision was extended longitudinally. Alexis-O retractor was placed carefully. The utero-vesical peritoneal reflection was incised transversely and the bladder flap was bluntly freed from the lower uterine segment. A low transverse uterine incision was made. Clear small amount of amniotic fluid drained. Baby BOY was delivered from cephalic  OA presentation at 15.21 hours on 09/02/2016.  Delayed cord clamping done after 1 minute and baby handed to NICU team in attendance. Apgar scores of 8 at one minute and 9 at five minutes. Cord ph was not sent.  Cord blood collected. The placenta was removed Intact and appeared normal. The uterine outline normal with no extensions noted. Tubes and ovaries appeared normal but with limited exposure. The uterine incision was closed with running locked sutures of 0Monocryl followed by a second imbricating layer.  Hemostasis was observed.  Alexis retractor removed. Peritoneal closure done with 2-0 Vicryl and Pyramidalis muscles brought together in midline. The fascia was then reapproximated with running sutures of 0Vicryl. The subcuticular closure was performed using 2-0plain gut. The skin was closed with 4-0Vicryl. Steristrips and Honeycomb dressing placed.   Instrument, sponge, and needle counts were correct prior the abdominal closure and were correct at the conclusion of the case.    Findings: BOY delivered from undescended OA position at 15.21 hours on 09/02/2016. Apgars 8 and 9. Baby weight pending. Low transverse hysterotomy closure in 2 layers and no extensions noted.    Estimated Blood Loss: 1 liter  Total IV Fluids:  2600 ml LR   Urine Output: 100CC OF clear urine in foley after fluid bolus given after baby delivery  Specimens: cord blood  Complications: no complications  Disposition: PACU - hemodynamically stable.   Maternal Condition: stable   Baby condition / location:  Couplet care / Skin to Skin  Attending Attestation: I performed the procedure.   Signed: Surgeon(s): Shea EvansVaishali Silvano Garofano, MD

## 2016-09-03 ENCOUNTER — Encounter (HOSPITAL_COMMUNITY): Payer: Self-pay | Admitting: Obstetrics and Gynecology

## 2016-09-03 LAB — CBC
HCT: 29.4 % — ABNORMAL LOW (ref 36.0–46.0)
HEMOGLOBIN: 9.8 g/dL — AB (ref 12.0–15.0)
MCH: 30.1 pg (ref 26.0–34.0)
MCHC: 33.3 g/dL (ref 30.0–36.0)
MCV: 90.2 fL (ref 78.0–100.0)
Platelets: 240 10*3/uL (ref 150–400)
RBC: 3.26 MIL/uL — ABNORMAL LOW (ref 3.87–5.11)
RDW: 14.1 % (ref 11.5–15.5)
WBC: 16.4 10*3/uL — ABNORMAL HIGH (ref 4.0–10.5)

## 2016-09-03 NOTE — Plan of Care (Signed)
Problem: Urinary Elimination: Goal: Ability to reestablish a normal urinary elimination pattern will improve Encouraged patient to drink plenty of liquids to increase urine output..Marland Kitchen

## 2016-09-03 NOTE — Addendum Note (Signed)
Addendum  created 09/03/16 0805 by Shanon PayorSuzanne M Dontel Harshberger, CRNA   Sign clinical note

## 2016-09-03 NOTE — Lactation Note (Signed)
This note was copied from a baby's chart. Lactation Consultation Note  Assisted mother with latching baby. Noted that baby's chin was quivering and that he did not elevate his tongue well to maintain a seal.  He suckled with breast compression but suckles were not long and deep. Some swallows were heard. Encouraged mother to continue BF and to follow-up with hand expression and spoon feeding. If he is not feeding better by tomorrow morning recommend initiation of DEBP. Pt and RN aware, follow-up tomorrow.  Patient Name: Maria Fleming [id number]'s Date: 09/03/2016 Reason for consult: Initial assessment   Maternal Data Has patient been taught Hand Expression?: Yes Does the patient have breastfeeding experience prior to this delivery?: No  Feeding Feeding Type: Breast Milk  LATCH Score/Interventions Latch: Repeated attempts needed to sustain latch, nipple held in mouth throughout feeding, stimulation needed to elicit sucking reflex.  Audible Swallowing: A few with stimulation  Type of Nipple: Everted at rest and after stimulation  Comfort (Breast/Nipple): Soft / non-tender     Hold (Positioning): Assistance needed to correctly position infant at breast and maintain latch.  LATCH Score: 7  Lactation Tools Discussed/Used     Consult Status Consult Status: Follow-up Date: 09/04/16 Follow-up type: In-patient    [name], Cliford Sequeira 09/03/2016, 3:22 PM

## 2016-09-03 NOTE — Progress Notes (Signed)
Patient ID: Maria Fleming, female   DOB: 05/22/1981, 35 y.o.   MRN: [id number] Subjective: S/P Primary Cesarean Delivery d/t Arrest of Dilatation / Arrest of Descent POD# 1 Information for the patient's newborn:  Dallie Pileslston, Boy Zarra [[id number]][[id number]]  female  / circ planning  Reports feeling well. Feeding: breast Patient reports tolerating PO.  Breast symptoms: (+) colostrum Pain controlled with ibuprofen (OTC) Denies HA/SOB/C/P/N/V/dizziness. Flatus belching, but no flatus. No BM. She reports vaginal bleeding as normal, without clots.  She is ambulating, urinating without difficult.     Objective:   VS:  Vitals:   09/02/16 2030 09/03/16 0010 09/03/16 0500 09/03/16 0838  BP: 118/61 109/70 113/88 (!) 115/54  Pulse: 75 67 77 76  Resp: 20 20 20 18   Temp: 98.2 F (36.8 C) 98.4 F (36.9 C) 98.4 F (36.9 C) 98.2 F (36.8 C)  TempSrc: Oral Oral Oral Oral  SpO2:    100%  Weight:      Height:         Intake/Output Summary (Last 24 hours) at 09/03/16 0908 Last data filed at 09/03/16 [id number]  Gross per 24 hour  Intake             2600 ml  Output             2250 ml  Net              350 ml        Recent Labs  09/01/16 0125 09/03/16 0537  WBC 9.3 16.4*  HGB 11.2* 9.8*  HCT 33.0* 29.4*  PLT 261 240     Blood type: A POS (11/04 0125)  Rubella: Immune (04/13 0000)     Physical Exam:   General: alert, cooperative and no distress  CV: Regular rate and rhythm, S1S2 present or without murmur or extra heart sounds  Resp: clear  Abdomen: soft, nontender, normal bowel sounds  Incision: dry, intact and small dried serous drainage present on Rt and Lt side of HC dressing - borders marked  Uterine Fundus: firm, 1 FB below umbilicus, nontender  Lochia: minimal  Ext: extremities normal, atraumatic, no cyanosis or edema, edema 1+ and Homans sign is negative, no sign of DVT   Assessment/Plan: 35 y.o.   POD# 1.  S/P Cesarean Delivery.  Indications: arrest of dilatation and arrest of descent                 Principal Problem:   Postpartum care following cesarean delivery (11/5) Active Problems:   Marginal insertion of umbilical cord affecting management of mother   Cesarean delivery delivered  Doing well, stable.               Regular diet as tolerated D/C IV per unit protocol Ambulate Routine post-op care Increase water intake to facilitate movement of interstitial fluid  Kenard GowerAWSON, Lyris Hitchman, M, MSN, CNM 09/03/2016, 9:08 AM

## 2016-09-03 NOTE — Anesthesia Postprocedure Evaluation (Signed)
Anesthesia Post Note  Patient: Maria Fleming  Procedure(s) Performed: Procedure(s) (LRB): CESAREAN SECTION (N/A)  Patient location during evaluation: Mother Baby Anesthesia Type: Epidural Level of consciousness: awake and alert and oriented Pain management: pain level controlled Vital Signs Assessment: post-procedure vital signs reviewed and stable Respiratory status: spontaneous breathing and nonlabored ventilation Cardiovascular status: stable Postop Assessment: no headache, no backache, patient able to bend at knees, no signs of nausea or vomiting and adequate PO intake Anesthetic complications: no     Last Vitals:  Vitals:   09/03/16 0010 09/03/16 0500  BP: 109/70 113/88  Pulse: 67 77  Resp: 20 20  Temp: 36.9 C 36.9 C    Last Pain:  Vitals:   09/03/16 0528  TempSrc:   PainSc: 3    Pain Goal: Patients Stated Pain Goal: 3 (09/02/16 1815)               Madison HickmanGREGORY,Creg Gilmer

## 2016-09-04 DIAGNOSIS — D62 Acute posthemorrhagic anemia: Secondary | ICD-10-CM | POA: Diagnosis not present

## 2016-09-04 MED ORDER — POLYSACCHARIDE IRON COMPLEX 150 MG PO CAPS
150.0000 mg | ORAL_CAPSULE | Freq: Every day | ORAL | Status: DC
Start: 2016-09-04 — End: 2016-09-05
  Administered 2016-09-04 – 2016-09-05 (×2): 150 mg via ORAL
  Filled 2016-09-04 (×2): qty 1

## 2016-09-04 MED ORDER — MAGNESIUM OXIDE 400 (241.3 MG) MG PO TABS
400.0000 mg | ORAL_TABLET | Freq: Every day | ORAL | Status: DC
Start: 2016-09-04 — End: 2016-09-05
  Administered 2016-09-04 – 2016-09-05 (×2): 400 mg via ORAL
  Filled 2016-09-04 (×3): qty 1

## 2016-09-04 NOTE — Progress Notes (Signed)
Subjective: POD# 2 Information for the patient's newborn:  Dallie Pileslston, Boy Edynn [[id number]][[id number]]  female   circ planned Baby name: Clydene Pughsher  Reports feeling better today. Feeding: breast Patient reports tolerating PO.  Breast symptoms: no pain Pain controlled withPO meds Denies HA/SOB/C/P/N/V/dizziness. Flatus present. She reports vaginal bleeding as normal, without clots.  She is ambulating, urinating without difficulty.     Objective:   VS:    Vitals:   09/03/16 0838 09/03/16 1513 09/03/16 1900 09/04/16 0543  BP: (!) 115/54 120/61 121/78 (!) 117/56  Pulse: 76 73 89 76  Resp: 18 20 18 18   Temp: 98.2 F (36.8 C) 98.6 F (37 C) 98.2 F (36.8 C) 98.2 F (36.8 C)  TempSrc: Oral Oral Oral Oral  SpO2: 100% 100%    Weight:      Height:         Intake/Output Summary (Last 24 hours) at 09/04/16 0827 Last data filed at 09/03/16 1000  Gross per 24 hour  Intake                0 ml  Output              600 ml  Net             -600 ml        Recent Labs  09/03/16 0537  WBC 16.4*  HGB 9.8*  HCT 29.4*  PLT 240     Blood type: --/--/A POS, A POS (11/04 0125)  Rubella: Immune (04/13 0000)     Physical Exam:  General: alert, cooperative and no distress CV: Regular rate and rhythm Resp: clear Abdomen: soft, nontender, normal bowel sounds Incision: dry, intact and minimal drainage Uterine Fundus: firm, below umbilicus, nontender Lochia: minimal Ext: edema +, no cords or tenderness      Assessment/Plan: 35 y.o.   POD# 2. G1P1001                  Principal Problem:   Postpartum care following cesarean delivery (11/5) - indication: Arrest of dilation Active Problems:   Cesarean delivery delivered   Acute blood loss anemia   Doing well, stable.               Started oral Fe and Mag-Ox, continue 4-6 weeks PP Encourage rest when baby rests Breastfeeding support Encourage to ambulate Routine post-op care Anticipate DC in AM  Neta Mendsaniela C Jhamal Plucinski, CNM, MSN 09/04/2016, 8:27  AM

## 2016-09-05 MED ORDER — IBUPROFEN 600 MG PO TABS: 600.0000 mg | ORAL_TABLET | Freq: Four times a day (QID) | ORAL | 0 refills | Status: DC

## 2016-09-05 MED ORDER — MAGNESIUM OXIDE 400 (241.3 MG) MG PO TABS: 400.0000 mg | ORAL_TABLET | Freq: Every day | ORAL | 0 refills | Status: DC

## 2016-09-05 MED ORDER — POLYSACCHARIDE IRON COMPLEX 150 MG PO CAPS: 150.0000 mg | ORAL_CAPSULE | Freq: Every day | ORAL | 0 refills | Status: DC

## 2016-09-05 MED ORDER — OXYCODONE HCL 5 MG PO TABS: 5.0000 mg | ORAL_TABLET | ORAL | 0 refills | Status: DC | PRN

## 2016-09-05 NOTE — Lactation Note (Signed)
This note was copied from a baby's chart. Lactation Consultation Note Mother reports that BF continues to improve. She denies any nipple tenderness. Taught mom breast compressions to aid in transfer and she was able to observe more active suckling. Reviewed engorgement prevention and relief. Aware of support groups and outpatient services.  Patient Name: Maria Fleming [id number]'KToday's Date: 09/05/2016 Reason for consult: Follow-up assessment   Maternal Data    Feeding Length of feed: 35 min  LATCH Score/Interventions Latch: Grasps breast easily, tongue down, lips flanged, rhythmical sucking. Intervention(s): Skin to skin  Audible Swallowing: Spontaneous and intermittent Intervention(s): Skin to skin  Type of Nipple: Everted at rest and after stimulation  Comfort (Breast/Nipple): Filling, red/small blisters or bruises, mild/mod discomfort  Problem noted: Mild/Moderate discomfort  Hold (Positioning): Assistance needed to correctly position infant at breast and maintain latch.  LATCH Score: 8  Lactation Tools Discussed/Used     Consult Status      [name], [name] 09/05/2016, 12:04 PM

## 2016-09-05 NOTE — Progress Notes (Signed)
POSTOPERATIVE DAY # 3 S/P CS   S:         Reports feeling ok today - some burning on side of incision but responds to pain meds             Tolerating po intake / no nausea / no vomiting / + flatus / + BM x2             Bleeding is light             Pain controlled with Motrin and oxycodone             Up ad lib / ambulatory/ voiding QS  Newborn breast feeding   O:  VS: BP 127/73   Pulse 77   Temp 98.1 F (36.7 C) (Oral)   Resp 19   Ht 5\' 6"  (1.676 m)   Wt 92.5 kg (204 lb)   SpO2 100%   Breastfeeding? Unknown   BMI 32.93 kg/m    LABS:               Recent Labs  09/03/16 0537  WBC 16.4*  HGB 9.8*  PLT 240               Bloodtype: --/--/A POS, A POS (11/04 0125)  Rubella: Immune (04/13 0000)                                tdap current - declines FLU             Physical Exam:             Alert and Oriented X3  Lungs: Clear and unlabored  Heart: regular rate and rhythm / no mumurs  Abdomen: soft, non-tender, non-distended              Fundus: firm, non-tender, U-1             Dressing intact honeycomb              Incision:  approximated with suture / no erythema / no ecchymosis / no drainage  Perineum: intact  Lochia: light  Extremities: trace pedal edema, no calf pain or tenderness, negative Homans  A:        POD # 3 S/P CS            Mild ABL anemia  P:        Routine postoperative care              DC home today - WOB booklet - instructions reviewed   Marlinda MikeBAILEY, Zayven Powe CNM, MSN, [facility] 09/05/2016, 7:51 AM

## 2016-09-05 NOTE — Discharge Summary (Signed)
OB Discharge Summary  Patient Name: Maria Fleming DOB: 02/28/1981 MRN: [id number]  Date of admission: 09/01/2016  Admitting diagnosis: INDUCTION for marginal cord insertion Intrauterine pregnancy: 6385w0d     Secondary diagnosis: None   Date of discharge: 09/05/2016     Discharge diagnosis: Term Pregnancy Delivered    Secondary diagnosis:  1) POD # 3 cesarean section for arrest of dilation and descent in active labor 2) Mild ABL anemia - stable   Prenatal history: G1P1001   EDC : 09/02/2016, by Other Basis  [facility]  Primary provider : [name] Prenatal course complicated by long history infertility (spontaneous pregnancy), marginal cord insertion, excessive weight gain in pregnancy (51 pounds)  Prenatal Labs: ABO, Rh: --/--/A POS, A POS (11/04 0125)  Antibody: NEG (11/04 0125) Rubella: Immune (04/13 0000)  RPR: Non Reactive (11/04 0125)  HBsAg: Negative (04/13 0000)  HIV: Non-reactive (04/13 0000)  GBS: Negative (10/12 0000)   Tdap 2017 Declines flu vaccine                                  Hospital course:  Induction of Labor With Cesarean Section  35 y.o. yo G1P1001 at 5885w0d was admitted to the hospital 09/01/2016 for induction of labor. Patient had a labor course significant for arrest of active labor, both dilation and descent. The patient went for cesarean section due to Arrest of Dilation and Arrest of Descent, and delivered a Viable female infant. Membrane Rupture Time/Date: )2:15 AM ,09/02/2016   @Details  of operation can be found in separate operative Note.  Patient had an uncomplicated postpartum course. She is ambulating, tolerating a regular diet, passing flatus, and urinating well.  Patient is discharged home in stable condition on 09/05/16.                                   Induction: cytotec x 2 Augmentation: AROM and Pitocin Delivering PROVIDER: [name], VAISHALI                                                             Complications: None  Newborn Data: Live born female  Birth Weight: 7 lb 4.2 oz (3295 g) APGAR: 10, 10  Baby Feeding: Breast Disposition:home with mother  Post partum procedures:none  Postpartum contraception: Not Discussed    Labs: Lab Results  Component Value Date   WBC 16.4 (H) 09/03/2016   HGB 9.8 (L) 09/03/2016   HCT 29.4 (L) 09/03/2016   MCV 90.2 09/03/2016   PLT 240 09/03/2016   CMP Latest Ref Rng & Units 08/21/2012  Glucose 70 - 99 mg/dL 93  BUN 6 - 23 mg/dL 11  Creatinine 4.090.50 - 8.111.10 mg/dL 9.140.80  Sodium 782135 - 956145 mEq/L 139  Potassium 3.5 - 5.1 mEq/L 3.9  Chloride 96 - 112 mEq/L 105  CO2 19 - 32 mEq/L 23  Calcium 8.4 - 10.5 mg/dL 9.6  Total Protein 6.0 - 8.3 g/dL -  Total Bilirubin 0.3 - 1.2 mg/dL -  Alkaline Phos 39 - 213117 U/L -  AST 0 - 37 U/L -  ALT 0 - 35 U/L -  Physical Exam @ time of discharge:  Vitals:   09/03/16 1900 09/04/16 0543 09/04/16 1820 09/05/16 0516  BP: 121/78 (!) 117/56 98/74 127/73  Pulse: 89 76 95 77  Resp: 18 18 20 19   Temp: 98.2 F (36.8 C) 98.2 F (36.8 C) 98.6 F (37 C) 98.1 F (36.7 C)  TempSrc: Oral Oral Oral Oral  SpO2:      Weight:      Height:        General: alert, cooperative and no distress Lochia: appropriate Uterine Fundus: firm Perineum: intact Incision: Healing well with no significant drainage Extremities: trace edema DVT Evaluation: No evidence of DVT seen on physical exam.   Discharge instructions:  "Baby and Me Booklet" and [facility] Booklet  Discharge Medications:    Medication List    STOP taking these medications   polyethylene glycol packet Commonly known as:  MIRALAX / GLYCOLAX     TAKE these medications   flintstones complete 60 MG chewable tablet Chew 1 tablet by mouth at bedtime.   ibuprofen 600 MG tablet Commonly known as:  ADVIL,MOTRIN Take 1 tablet (600 mg total) by mouth every 6 (six) hours.   iron polysaccharides 150 MG capsule Commonly known as:  NIFEREX Take 1 capsule  (150 mg total) by mouth daily.   magnesium oxide 400 (241.3 Mg) MG tablet Commonly known as:  MAG-OX Take 1 tablet (400 mg total) by mouth daily.   oxyCODONE 5 MG immediate release tablet Commonly known as:  Oxy IR/ROXICODONE Take 1 tablet (5 mg total) by mouth every 4 (four) hours as needed (pain scale 4-7).       Diet: routine diet  Activity: Advance as tolerated. Pelvic rest x 6 weeks.   Follow up:6 weeks    Signed: Marlinda MikeBAILEY, TANYA CNM, MSN, [facility] 09/05/2016, 9:32 AM

## 2016-10-15 DIAGNOSIS — Z1151 Encounter for screening for human papillomavirus (HPV): Secondary | ICD-10-CM | POA: Diagnosis not present

## 2016-10-15 DIAGNOSIS — Z113 Encounter for screening for infections with a predominantly sexual mode of transmission: Secondary | ICD-10-CM | POA: Diagnosis not present

## 2017-05-03 DIAGNOSIS — R05 Cough: Secondary | ICD-10-CM | POA: Diagnosis not present

## 2017-10-29 NOTE — L&D Delivery Note (Signed)
Operative Delivery Note At 10:28 PM a viable and healthy female was delivered via VBAC, Vacuum Assisted.  Presentation: vertex; Position: Left,, Occiput,, Anterior; Station: +4. Vacuum assist for recurrent deep variable decelerations.  Verbal consent: obtained from patient.  Risks and benefits discussed in detail.  Risks include, but are not limited to the risks of anesthesia, bleeding, infection, damage to maternal tissues, fetal cephalhematoma.  There is also the risk of inability to effect vaginal delivery of the head, or shoulder dystocia that cannot be resolved by established maneuvers, leading to the need for emergency cesarean section.  APGAR: 8, 9; weight pending .   Placenta status: spontaneous, intact.   Cord:  Spontaneous with the following complications:na  Cord pH: na  Anesthesia:  epidural Instruments: kiwi x one pull Episiotomy: None Lacerations:  second Suture Repair: 2.0 vicryl rapide and 0 vicryl Est. Blood Loss (mL):  450  Successful VBAC  Mom to postpartum.  Baby to Couplet care / Skin to Skin.  Jasleen Riepe J 10/12/2018, 10:46 PM

## 2018-02-07 DIAGNOSIS — Z6825 Body mass index (BMI) 25.0-25.9, adult: Secondary | ICD-10-CM | POA: Diagnosis not present

## 2018-02-07 DIAGNOSIS — Z01419 Encounter for gynecological examination (general) (routine) without abnormal findings: Secondary | ICD-10-CM | POA: Diagnosis not present

## 2018-02-18 DIAGNOSIS — Z3200 Encounter for pregnancy test, result unknown: Secondary | ICD-10-CM | POA: Diagnosis not present

## 2018-03-11 DIAGNOSIS — Z3201 Encounter for pregnancy test, result positive: Secondary | ICD-10-CM | POA: Diagnosis not present

## 2018-03-28 DIAGNOSIS — Z124 Encounter for screening for malignant neoplasm of cervix: Secondary | ICD-10-CM | POA: Diagnosis not present

## 2018-03-28 DIAGNOSIS — Z3689 Encounter for other specified antenatal screening: Secondary | ICD-10-CM | POA: Diagnosis not present

## 2018-03-28 DIAGNOSIS — O09521 Supervision of elderly multigravida, first trimester: Secondary | ICD-10-CM | POA: Diagnosis not present

## 2018-03-28 DIAGNOSIS — Z118 Encounter for screening for other infectious and parasitic diseases: Secondary | ICD-10-CM | POA: Diagnosis not present

## 2018-03-28 DIAGNOSIS — Z3481 Encounter for supervision of other normal pregnancy, first trimester: Secondary | ICD-10-CM | POA: Diagnosis not present

## 2018-03-28 LAB — OB RESULTS CONSOLE ABO/RH: RH Type: POSITIVE

## 2018-03-28 LAB — OB RESULTS CONSOLE HEPATITIS B SURFACE ANTIGEN: Hepatitis B Surface Ag: NEGATIVE

## 2018-03-28 LAB — OB RESULTS CONSOLE RUBELLA ANTIBODY, IGM: RUBELLA: IMMUNE

## 2018-03-28 LAB — OB RESULTS CONSOLE GC/CHLAMYDIA
CHLAMYDIA, DNA PROBE: NEGATIVE
GC PROBE AMP, GENITAL: NEGATIVE

## 2018-03-28 LAB — OB RESULTS CONSOLE ANTIBODY SCREEN: Antibody Screen: NEGATIVE

## 2018-03-28 LAB — OB RESULTS CONSOLE HIV ANTIBODY (ROUTINE TESTING): HIV: NONREACTIVE

## 2018-03-28 LAB — OB RESULTS CONSOLE RPR: RPR: NONREACTIVE

## 2018-04-11 DIAGNOSIS — Z3689 Encounter for other specified antenatal screening: Secondary | ICD-10-CM | POA: Diagnosis not present

## 2018-04-11 DIAGNOSIS — Z3A13 13 weeks gestation of pregnancy: Secondary | ICD-10-CM | POA: Diagnosis not present

## 2018-04-11 DIAGNOSIS — O4691 Antepartum hemorrhage, unspecified, first trimester: Secondary | ICD-10-CM | POA: Diagnosis not present

## 2018-04-25 DIAGNOSIS — O4692 Antepartum hemorrhage, unspecified, second trimester: Secondary | ICD-10-CM | POA: Diagnosis not present

## 2018-04-25 DIAGNOSIS — Z3A15 15 weeks gestation of pregnancy: Secondary | ICD-10-CM | POA: Diagnosis not present

## 2018-04-25 DIAGNOSIS — Z361 Encounter for antenatal screening for raised alphafetoprotein level: Secondary | ICD-10-CM | POA: Diagnosis not present

## 2018-05-28 DIAGNOSIS — O09519 Supervision of elderly primigravida, unspecified trimester: Secondary | ICD-10-CM | POA: Diagnosis not present

## 2018-05-28 DIAGNOSIS — Z3A20 20 weeks gestation of pregnancy: Secondary | ICD-10-CM | POA: Diagnosis not present

## 2018-05-28 DIAGNOSIS — O09522 Supervision of elderly multigravida, second trimester: Secondary | ICD-10-CM | POA: Diagnosis not present

## 2018-06-24 DIAGNOSIS — O43192 Other malformation of placenta, second trimester: Secondary | ICD-10-CM | POA: Diagnosis not present

## 2018-06-24 DIAGNOSIS — Z3A24 24 weeks gestation of pregnancy: Secondary | ICD-10-CM | POA: Diagnosis not present

## 2018-07-21 DIAGNOSIS — Z3A27 27 weeks gestation of pregnancy: Secondary | ICD-10-CM | POA: Diagnosis not present

## 2018-07-21 DIAGNOSIS — O43192 Other malformation of placenta, second trimester: Secondary | ICD-10-CM | POA: Diagnosis not present

## 2018-07-21 DIAGNOSIS — Z3689 Encounter for other specified antenatal screening: Secondary | ICD-10-CM | POA: Diagnosis not present

## 2018-07-21 DIAGNOSIS — O09519 Supervision of elderly primigravida, unspecified trimester: Secondary | ICD-10-CM | POA: Diagnosis not present

## 2018-07-21 DIAGNOSIS — Z23 Encounter for immunization: Secondary | ICD-10-CM | POA: Diagnosis not present

## 2018-08-05 DIAGNOSIS — Z3689 Encounter for other specified antenatal screening: Secondary | ICD-10-CM | POA: Diagnosis not present

## 2018-08-05 DIAGNOSIS — O09519 Supervision of elderly primigravida, unspecified trimester: Secondary | ICD-10-CM | POA: Diagnosis not present

## 2018-08-05 DIAGNOSIS — Z3A30 30 weeks gestation of pregnancy: Secondary | ICD-10-CM | POA: Diagnosis not present

## 2018-08-05 DIAGNOSIS — O43193 Other malformation of placenta, third trimester: Secondary | ICD-10-CM | POA: Diagnosis not present

## 2018-08-11 DIAGNOSIS — M62838 Other muscle spasm: Secondary | ICD-10-CM | POA: Diagnosis not present

## 2018-08-11 DIAGNOSIS — M79605 Pain in left leg: Secondary | ICD-10-CM | POA: Diagnosis not present

## 2018-08-19 DIAGNOSIS — O09519 Supervision of elderly primigravida, unspecified trimester: Secondary | ICD-10-CM | POA: Diagnosis not present

## 2018-08-19 DIAGNOSIS — Z3A32 32 weeks gestation of pregnancy: Secondary | ICD-10-CM | POA: Diagnosis not present

## 2018-08-19 DIAGNOSIS — O43193 Other malformation of placenta, third trimester: Secondary | ICD-10-CM | POA: Diagnosis not present

## 2018-09-04 DIAGNOSIS — O09513 Supervision of elderly primigravida, third trimester: Secondary | ICD-10-CM | POA: Diagnosis not present

## 2018-09-04 DIAGNOSIS — Z3A34 34 weeks gestation of pregnancy: Secondary | ICD-10-CM | POA: Diagnosis not present

## 2018-09-04 DIAGNOSIS — O43193 Other malformation of placenta, third trimester: Secondary | ICD-10-CM | POA: Diagnosis not present

## 2018-09-10 DIAGNOSIS — H5213 Myopia, bilateral: Secondary | ICD-10-CM | POA: Diagnosis not present

## 2018-09-16 DIAGNOSIS — Z3A36 36 weeks gestation of pregnancy: Secondary | ICD-10-CM | POA: Diagnosis not present

## 2018-09-16 DIAGNOSIS — O43193 Other malformation of placenta, third trimester: Secondary | ICD-10-CM | POA: Diagnosis not present

## 2018-09-16 DIAGNOSIS — O09513 Supervision of elderly primigravida, third trimester: Secondary | ICD-10-CM | POA: Diagnosis not present

## 2018-09-16 DIAGNOSIS — Z3685 Encounter for antenatal screening for Streptococcus B: Secondary | ICD-10-CM | POA: Diagnosis not present

## 2018-09-16 LAB — OB RESULTS CONSOLE GBS: STREP GROUP B AG: NEGATIVE

## 2018-09-23 DIAGNOSIS — Z3A37 37 weeks gestation of pregnancy: Secondary | ICD-10-CM | POA: Diagnosis not present

## 2018-09-23 DIAGNOSIS — O43193 Other malformation of placenta, third trimester: Secondary | ICD-10-CM | POA: Diagnosis not present

## 2018-10-02 DIAGNOSIS — Z3A38 38 weeks gestation of pregnancy: Secondary | ICD-10-CM | POA: Diagnosis not present

## 2018-10-02 DIAGNOSIS — O43193 Other malformation of placenta, third trimester: Secondary | ICD-10-CM | POA: Diagnosis not present

## 2018-10-06 ENCOUNTER — Encounter (HOSPITAL_COMMUNITY): Payer: Self-pay | Admitting: *Deleted

## 2018-10-06 ENCOUNTER — Telehealth (HOSPITAL_COMMUNITY): Payer: Self-pay | Admitting: *Deleted

## 2018-10-06 NOTE — Telephone Encounter (Signed)
Preadmission screen  

## 2018-10-07 ENCOUNTER — Telehealth (HOSPITAL_COMMUNITY): Payer: Self-pay | Admitting: *Deleted

## 2018-10-07 ENCOUNTER — Other Ambulatory Visit: Payer: Self-pay | Admitting: Obstetrics & Gynecology

## 2018-10-07 DIAGNOSIS — Z3A39 39 weeks gestation of pregnancy: Secondary | ICD-10-CM | POA: Diagnosis not present

## 2018-10-07 DIAGNOSIS — O09513 Supervision of elderly primigravida, third trimester: Secondary | ICD-10-CM | POA: Diagnosis not present

## 2018-10-07 NOTE — Telephone Encounter (Signed)
Preadmission screen  

## 2018-10-08 ENCOUNTER — Telehealth (HOSPITAL_COMMUNITY): Payer: Self-pay | Admitting: *Deleted

## 2018-10-08 ENCOUNTER — Encounter (HOSPITAL_COMMUNITY): Payer: Self-pay

## 2018-10-08 NOTE — Telephone Encounter (Signed)
Preadmission screen  

## 2018-10-12 ENCOUNTER — Inpatient Hospital Stay (HOSPITAL_COMMUNITY): Payer: BLUE CROSS/BLUE SHIELD | Admitting: Anesthesiology

## 2018-10-12 ENCOUNTER — Encounter (HOSPITAL_COMMUNITY): Payer: Self-pay | Admitting: *Deleted

## 2018-10-12 ENCOUNTER — Inpatient Hospital Stay (EMERGENCY_DEPARTMENT_HOSPITAL)
Admission: AD | Admit: 2018-10-12 | Discharge: 2018-10-12 | Disposition: A | Discharge status: Home / Self Care | Payer: BLUE CROSS/BLUE SHIELD | Source: Home / Self Care | Attending: Obstetrics | Admitting: Obstetrics

## 2018-10-12 ENCOUNTER — Encounter (HOSPITAL_COMMUNITY): Payer: Self-pay

## 2018-10-12 ENCOUNTER — Inpatient Hospital Stay (HOSPITAL_COMMUNITY)
Admission: AD | Admit: 2018-10-12 | Discharge: 2018-10-14 | DRG: 806 | Disposition: A | Discharge status: Home / Self Care | Payer: BLUE CROSS/BLUE SHIELD | Source: Ambulatory Visit | Attending: Obstetrics and Gynecology | Admitting: Obstetrics and Gynecology

## 2018-10-12 DIAGNOSIS — O34219 Maternal care for unspecified type scar from previous cesarean delivery: Secondary | ICD-10-CM | POA: Diagnosis not present

## 2018-10-12 DIAGNOSIS — O34211 Maternal care for low transverse scar from previous cesarean delivery: Secondary | ICD-10-CM | POA: Diagnosis not present

## 2018-10-12 DIAGNOSIS — Z3A39 39 weeks gestation of pregnancy: Secondary | ICD-10-CM

## 2018-10-12 DIAGNOSIS — O471 False labor at or after 37 completed weeks of gestation: Secondary | ICD-10-CM | POA: Diagnosis not present

## 2018-10-12 DIAGNOSIS — O99019 Anemia complicating pregnancy, unspecified trimester: Secondary | ICD-10-CM | POA: Diagnosis present

## 2018-10-12 DIAGNOSIS — D509 Iron deficiency anemia, unspecified: Secondary | ICD-10-CM | POA: Diagnosis present

## 2018-10-12 DIAGNOSIS — O9081 Anemia of the puerperium: Secondary | ICD-10-CM | POA: Diagnosis not present

## 2018-10-12 DIAGNOSIS — O479 False labor, unspecified: Secondary | ICD-10-CM

## 2018-10-12 DIAGNOSIS — Z3483 Encounter for supervision of other normal pregnancy, third trimester: Secondary | ICD-10-CM | POA: Diagnosis not present

## 2018-10-12 DIAGNOSIS — D62 Acute posthemorrhagic anemia: Secondary | ICD-10-CM | POA: Diagnosis not present

## 2018-10-12 LAB — CBC
HEMATOCRIT: 37 % (ref 36.0–46.0)
Hemoglobin: 12.1 g/dL (ref 12.0–15.0)
MCH: 28.6 pg (ref 26.0–34.0)
MCHC: 32.7 g/dL (ref 30.0–36.0)
MCV: 87.5 fL (ref 80.0–100.0)
Platelets: 268 10*3/uL (ref 150–400)
RBC: 4.23 MIL/uL (ref 3.87–5.11)
RDW: 14.4 % (ref 11.5–15.5)
WBC: 10.7 10*3/uL — AB (ref 4.0–10.5)
nRBC: 0 % (ref 0.0–0.2)

## 2018-10-12 LAB — TYPE AND SCREEN
ABO/RH(D): A POS
Antibody Screen: NEGATIVE

## 2018-10-12 MED ORDER — SOD CITRATE-CITRIC ACID 500-334 MG/5ML PO SOLN: 30.0000 mL | ORAL | Status: DC | PRN

## 2018-10-12 MED ORDER — LACTATED RINGERS IV SOLN
500.0000 mL | Freq: Once | INTRAVENOUS | Status: AC
  Administered 2018-10-12: 500 mL via INTRAVENOUS

## 2018-10-12 MED ORDER — EPHEDRINE 5 MG/ML INJ: 10.0000 mg | INTRAVENOUS | Status: DC | PRN

## 2018-10-12 MED ORDER — OXYTOCIN 40 UNITS IN LACTATED RINGERS INFUSION - SIMPLE MED
2.5000 [IU]/h | INTRAVENOUS | Status: DC
  Administered 2018-10-12: 2.5 [IU]/h via INTRAVENOUS
  Filled 2018-10-12: qty 1000

## 2018-10-12 MED ORDER — PROPOFOL 10 MG/ML IV BOLUS
INTRAVENOUS | Status: AC
  Filled 2018-10-12: qty 40

## 2018-10-12 MED ORDER — LACTATED RINGERS IV SOLN
INTRAVENOUS | Status: DC
  Administered 2018-10-12: 19:00:00 via INTRAVENOUS

## 2018-10-12 MED ORDER — LIDOCAINE HCL (PF) 1 % IJ SOLN
INTRAMUSCULAR | Status: DC | PRN
  Administered 2018-10-12: 5 mL via EPIDURAL
  Administered 2018-10-12: 3 mL via EPIDURAL
  Administered 2018-10-12: 2 mL via EPIDURAL

## 2018-10-12 MED ORDER — PHENYLEPHRINE 40 MCG/ML (10ML) SYRINGE FOR IV PUSH (FOR BLOOD PRESSURE SUPPORT)
80.0000 ug | PREFILLED_SYRINGE | INTRAVENOUS | Status: DC | PRN
  Filled 2018-10-12: qty 10

## 2018-10-12 MED ORDER — LIDOCAINE HCL (CARDIAC) PF 100 MG/5ML IV SOSY
PREFILLED_SYRINGE | INTRAVENOUS | Status: AC
  Filled 2018-10-12: qty 15

## 2018-10-12 MED ORDER — DIPHENHYDRAMINE HCL 50 MG/ML IJ SOLN: 12.5000 mg | INTRAMUSCULAR | Status: DC | PRN

## 2018-10-12 MED ORDER — LIDOCAINE HCL (PF) 1 % IJ SOLN
30.0000 mL | INTRAMUSCULAR | Status: DC | PRN
  Filled 2018-10-12: qty 30

## 2018-10-12 MED ORDER — OXYCODONE-ACETAMINOPHEN 5-325 MG PO TABS: 2.0000 | ORAL_TABLET | ORAL | Status: DC | PRN

## 2018-10-12 MED ORDER — FENTANYL 2.5 MCG/ML BUPIVACAINE 1/10 % EPIDURAL INFUSION (WH - ANES)
14.0000 mL/h | INTRAMUSCULAR | Status: DC | PRN
  Administered 2018-10-12: 14 mL/h via EPIDURAL
  Filled 2018-10-12: qty 100

## 2018-10-12 MED ORDER — EPHEDRINE 5 MG/ML INJ
10.0000 mg | INTRAVENOUS | Status: DC | PRN
  Filled 2018-10-12: qty 2

## 2018-10-12 MED ORDER — ONDANSETRON HCL 4 MG/2ML IJ SOLN: 4.0000 mg | Freq: Four times a day (QID) | INTRAMUSCULAR | Status: DC | PRN

## 2018-10-12 MED ORDER — LACTATED RINGERS IV SOLN: 500.0000 mL | Freq: Once | INTRAVENOUS | Status: DC

## 2018-10-12 MED ORDER — PHENYLEPHRINE 40 MCG/ML (10ML) SYRINGE FOR IV PUSH (FOR BLOOD PRESSURE SUPPORT)
80.0000 ug | PREFILLED_SYRINGE | INTRAVENOUS | Status: DC | PRN
  Filled 2018-10-12 (×2): qty 10

## 2018-10-12 MED ORDER — OXYCODONE-ACETAMINOPHEN 5-325 MG PO TABS: 1.0000 | ORAL_TABLET | ORAL | Status: DC | PRN

## 2018-10-12 MED ORDER — LACTATED RINGERS IV SOLN: 500.0000 mL | INTRAVENOUS | Status: DC | PRN

## 2018-10-12 MED ORDER — FLEET ENEMA 7-19 GM/118ML RE ENEM: 1.0000 | ENEMA | RECTAL | Status: DC | PRN

## 2018-10-12 MED ORDER — SUCCINYLCHOLINE CHLORIDE 200 MG/10ML IV SOSY
PREFILLED_SYRINGE | INTRAVENOUS | Status: AC
  Filled 2018-10-12: qty 10

## 2018-10-12 MED ORDER — ATROPINE SULFATE 0.4 MG/ML IJ SOLN
INTRAMUSCULAR | Status: AC
  Filled 2018-10-12: qty 1

## 2018-10-12 MED ORDER — SODIUM BICARBONATE 8.4 % IV SOLN
INTRAVENOUS | Status: AC
  Filled 2018-10-12: qty 50

## 2018-10-12 MED ORDER — DIPHENHYDRAMINE HCL 50 MG/ML IJ SOLN
INTRAMUSCULAR | Status: AC
  Filled 2018-10-12: qty 1

## 2018-10-12 MED ORDER — OXYTOCIN BOLUS FROM INFUSION
500.0000 mL | Freq: Once | INTRAVENOUS | Status: AC
  Administered 2018-10-12: 500 mL via INTRAVENOUS

## 2018-10-12 MED ORDER — METOCLOPRAMIDE HCL 5 MG/ML IJ SOLN
INTRAMUSCULAR | Status: AC
  Filled 2018-10-12: qty 2

## 2018-10-12 MED ORDER — PHENYLEPHRINE 40 MCG/ML (10ML) SYRINGE FOR IV PUSH (FOR BLOOD PRESSURE SUPPORT): 80.0000 ug | PREFILLED_SYRINGE | INTRAVENOUS | Status: DC | PRN

## 2018-10-12 MED ORDER — ACETAMINOPHEN 325 MG PO TABS: 650.0000 mg | ORAL_TABLET | ORAL | Status: DC | PRN

## 2018-10-12 NOTE — Anesthesia Pain Management Evaluation Note (Signed)
  CRNA Pain Management Visit Note  Patient: [name], 37 y.o., female  "Hello [facility]. We have an anesthesia team available at all times to provide care throughout the hospital, including epidural management and anesthesia for C-section. I don't know your plan for the delivery whether it a natural birth, water birth, IV sedation, nitrous supplementation, doula or epidural, but we want to meet your pain goals."   1.Was your pain managed to your expectations on prior hospitalizations?   Yes   2.What is your expectation for pain management during this hospitalization?     Epidural  3.How can we help you reach that goal? Epidural in place and is working well.  Record the patient's initial score and the patient's pain goal.   Pain: 0  Pain Goal: 5 [facility] wants you to be able to say your pain was always managed very well.  Maria Fleming 10/12/2018

## 2018-10-12 NOTE — Anesthesia Procedure Notes (Signed)
Epidural Patient location during procedure: OB Start time: 10/12/2018 7:15 PM End time: 10/12/2018 7:22 PM  Staffing Anesthesiologist: Marcene DuosFitzgerald, Ladonne Sharples, MD Performed: anesthesiologist   Preanesthetic Checklist Completed: patient identified, site marked, surgical consent, pre-op evaluation, timeout performed, IV checked, risks and benefits discussed and monitors and equipment checked  Epidural Patient position: sitting Prep: site prepped and draped and DuraPrep Patient monitoring: continuous pulse ox and blood pressure Approach: midline Location: L4-L5 Injection technique: LOR air  Needle:  Needle type: Tuohy  Needle gauge: 17 G Needle length: 9 cm and 9 Needle insertion depth: 7 cm Catheter type: closed end flexible Catheter size: 19 Gauge Catheter at skin depth: 12 cm Test dose: negative  Assessment Events: blood not aspirated, injection not painful, no injection resistance, negative IV test and no paresthesia

## 2018-10-12 NOTE — MAU Provider Note (Addendum)
First Provider Initiated Contact with Patient 10/12/18 1148     S: Ms. Maria Fleming is a 37 y.o. G2P1001 at 3079w5d  who presents to MAU today complaining contractions q 5 minutes since 0800. She rates the pain a 3/10 and has not tried anything for the pain. She is scheduled for a repeat c/s on Tuesday but [facility] if she goes into labor on her own. She still desires a vaginal delivery. She denies vaginal bleeding. She denies LOF. She reports normal fetal movement.    O: BP 124/70 (BP Location: Right Arm)   Pulse (!) 104   Temp 98.2 F (36.8 C) (Oral)   Resp 18   Ht 5\' 7"  (1.702 m)   Wt 90.8 kg   BMI 31.36 kg/m  GENERAL: Well-developed, well-nourished female in no acute distress.  HEAD: Normocephalic, atraumatic.  CHEST: Normal effort of breathing, regular heart rate ABDOMEN: Soft, nontender, gravid  Cervical exam:  Dilation: Closed Effacement (%): Thick Cervical Position: Posterior Presentation: Vertex Exam by:: Ma Hillock. Catalino Plascencia, CNM   Fetal Monitoring: Baseline: 140 Variability: moderate Accelerations: 15x15 Decelerations: none Contractions: 5-7   A: SIUP at 4179w5d  False labor  P: Discharge home. -Labor precautions reviewed -Patient may return to MAU if condition changes or worsens.  Rolm Bookbindereill, Lorien Shingler M, [place]CNM 10/12/2018 11:48 AM

## 2018-10-12 NOTE — H&P (Signed)
Maria Fleming is a 37 y.o. female presenting for labor. OB History    Gravida  2   Para  1   Term  1   Preterm      AB      Living  1     SAB      TAB      Ectopic      Multiple  0   Live Births  1          Past Medical History:  Diagnosis Date  . Cervix abnormality    precancerous cells  . Condyloma acuminata   . Endometriosis   . Enlarged thyroid   . Gastroenteritis    has been seen by specialist, unsure of official dx  . Hx of chlamydia infection   . No pertinent past medical history   . Ovarian cyst   . Postpartum care following cesarean delivery (11/5) 09/03/2016  . Vaginal Pap smear, abnormal    Past Surgical History:  Procedure Laterality Date  . BREAST LUMPECTOMY    . BUNIONECTOMY    . CESAREAN SECTION N/A 09/02/2016   Procedure: CESAREAN SECTION;  Surgeon: Shea EvansVaishali [name], MD;  [facility];  Service: Obstetrics;  Laterality: N/A;  . CYSTECTOMY W/ URETEROSIGMOIDOSTOMY  02/24/2015  . OVARIAN CYST REMOVAL  08/21/2012   Procedure: OVARIAN CYSTECTOMY;  Surgeon: Robley FriesVaishali R [name], MD;  Location: [facility];  Service: Gynecology;  Laterality: Bilateral;  . ROBOTIC ASSISTED LAPAROSCOPIC LYSIS OF ADHESION N/A 02/24/2015   Procedure: ROBOTIC ASSISTED LAPAROSCOPIC LYSIS OF ADHESION/Ablation of Endometriosis; bilateral endometrioma cyst excision;  Surgeon: Shea EvansVaishali [name], MD;  Location: [facility];  Service: Gynecology;  Laterality: N/A;  3 1/2 hrs.  . WISDOM TOOTH EXTRACTION     Family History: family history includes Cancer in her father; Diabetes in her mother; Hypertension in her mother. Social History:  reports that she has never smoked. She has never used smokeless tobacco. She reports that she does not drink alcohol or use drugs.     Maternal Diabetes: No Genetic Screening: Normal Maternal Ultrasounds/Referrals: Normal Fetal Ultrasounds or other Referrals:  None Maternal Substance Abuse:  No Significant Maternal Medications:  None Significant  Maternal Lab Results:  None Other Comments:  None  Review of Systems  Constitutional: Negative.   All other systems reviewed and are negative.  Maternal Medical History:  Reason for admission: Contractions.   Contractions: Onset was 1-2 hours ago.   Frequency: regular.   Perceived severity is moderate.    Fetal activity: Perceived fetal activity is normal.   Last perceived fetal movement was within the past hour.    Prenatal complications: no prenatal complications Prenatal Complications - Diabetes: none.    Dilation: 7.5 Effacement (%): 100 Station: -1 Exam by:: Arne ClevelandJennifer Hazelwood, RN Blood pressure 118/77, pulse (!) 104, temperature (!) 97.4 F (36.3 C), temperature source Oral, resp. rate 18, SpO2 100 %, unknown if currently breastfeeding. Maternal Exam:  Uterine Assessment: Contraction strength is moderate.  Contraction frequency is regular.   Abdomen: Patient reports no abdominal tenderness. Surgical scars: low transverse.   Fetal presentation: vertex  Introitus: Normal vulva. Normal vagina.  Ferning test: positive.  Nitrazine test: positive. Amniotic fluid character: meconium stained.  Pelvis: questionable for delivery.   Cervix: Cervix evaluated by digital exam.     Physical Exam  Nursing note and vitals reviewed. Constitutional: She is oriented to person, place, and time. She appears well-developed and well-nourished.  Neck: Normal range of motion. Neck supple.  Cardiovascular:  Normal rate and regular rhythm.  Respiratory: Effort normal and breath sounds normal.  GI: Soft. Bowel sounds are normal.  Genitourinary:    Vulva, vagina and uterus normal.   Musculoskeletal: Normal range of motion.  Neurological: She is alert and oriented to person, place, and time. She has normal reflexes.  Skin: Skin is warm and dry.  Psychiatric: She has a normal mood and affect.    Prenatal labs: ABO, Rh: A/Positive/-- (05/31 0000) Antibody: n (05/31 0000) Rubella:  Immune (05/31 0000) RPR: Nonreactive (05/31 0000)  HBsAg: Negative (05/31 0000)  HIV: Non-reactive (05/31 0000)  GBS: Negative (11/19 0000)   Assessment/Plan: Term IUP Active labor Previous csection Now desires TOLAC Counseling done.   Zaeem Kandel J 10/12/2018, 7:31 PM

## 2018-10-12 NOTE — Discharge Instructions (Signed)

## 2018-10-12 NOTE — Progress Notes (Addendum)
Maria Fleming is a 37 y.o. G2P1001 at 8019w5d by LMP admitted for active labor  Subjective: Pushing  Objective: BP 124/82 (BP Location: Right Arm)   Pulse 99   Temp (!) 97.4 F (36.3 C) (Oral)   Resp 18   SpO2 99%  No intake/output data recorded. No intake/output data recorded.  FHT:  FHR: 155 bpm, variability: moderate,  accelerations:  Present,  decelerations:  Present variable decels with pushing UC:   regular, every 2 minutes SVE:   Dilation: 10 Effacement (%): 100 Station: Plus 2 Exam by:: Aundria Rudogers RN  Labs: Lab Results  Component Value Date   WBC 10.7 (H) 10/12/2018   HGB 12.1 10/12/2018   HCT 37.0 10/12/2018   MCV 87.5 10/12/2018   PLT 268 10/12/2018    Assessment / Plan: Spontaneous labor, progressing normally  TOLAC  Labor: Progressing normally Preeclampsia:  no signs or symptoms of toxicity Fetal Wellbeing:  Category I and cat 2 Pain Control:  Epidural I/D:  n/a Anticipated MOD:  TOLAC  Ardie Dragoo J 10/12/2018, 10:00 PM

## 2018-10-12 NOTE — Anesthesia Preprocedure Evaluation (Signed)
Anesthesia Evaluation  Patient identified by MRN, date of birth, ID band Patient awake    Reviewed: Allergy & Precautions, Patient's Chart, lab work & pertinent test results  Airway Mallampati: II  TM Distance: >3 FB     Dental   Pulmonary neg pulmonary ROS,    breath sounds clear to auscultation       Cardiovascular negative cardio ROS   Rhythm:Regular Rate:Normal     Neuro/Psych negative neurological ROS     GI/Hepatic negative GI ROS, Neg liver ROS,   Endo/Other  negative endocrine ROS  Renal/GU negative Renal ROS     Musculoskeletal   Abdominal   Peds  Hematology negative hematology ROS (+)   Anesthesia Other Findings   Reproductive/Obstetrics (+) Pregnancy (TOLAC. Previous c/s 2/2 arrest of descent)                             Lab Results  Component Value Date   WBC 10.7 (H) 10/12/2018   HGB 12.1 10/12/2018   HCT 37.0 10/12/2018   MCV 87.5 10/12/2018   PLT 268 10/12/2018    Anesthesia Physical Anesthesia Plan  ASA: II  Anesthesia Plan: Epidural   Post-op Pain Management:    Induction:   PONV Risk Score and Plan: Treatment may vary due to age or medical condition  Airway Management Planned: Natural Airway  Additional Equipment:   Intra-op Plan:   Post-operative Plan:   Informed Consent: I have reviewed the patients History and Physical, chart, labs and discussed the procedure including the risks, benefits and alternatives for the proposed anesthesia with the patient or authorized representative who has indicated his/her understanding and acceptance.     Plan Discussed with:   Anesthesia Plan Comments:         Anesthesia Quick Evaluation

## 2018-10-12 NOTE — MAU Note (Signed)
Started having cramping pain since yesterday and through the night and this morning the pain has been getting worse and closer. Ctx every 5-6 min, lasting 1 min  No bleeding, no LOF  + FM

## 2018-10-13 ENCOUNTER — Encounter (HOSPITAL_COMMUNITY): Payer: Self-pay

## 2018-10-13 ENCOUNTER — Encounter (HOSPITAL_COMMUNITY)
Admission: RE | Admit: 2018-10-13 | Discharge: 2018-10-13 | Disposition: A | Discharge status: Home / Self Care | Payer: BLUE CROSS/BLUE SHIELD | Source: Ambulatory Visit

## 2018-10-13 DIAGNOSIS — D509 Iron deficiency anemia, unspecified: Secondary | ICD-10-CM | POA: Diagnosis present

## 2018-10-13 DIAGNOSIS — O34219 Maternal care for unspecified type scar from previous cesarean delivery: Secondary | ICD-10-CM | POA: Diagnosis not present

## 2018-10-13 DIAGNOSIS — O99019 Anemia complicating pregnancy, unspecified trimester: Secondary | ICD-10-CM | POA: Diagnosis present

## 2018-10-13 HISTORY — DX: Anogenital (venereal) warts: A63.0

## 2018-10-13 LAB — CBC
HEMATOCRIT: 31.1 % — AB (ref 36.0–46.0)
Hemoglobin: 9.8 g/dL — ABNORMAL LOW (ref 12.0–15.0)
MCH: 27.1 pg (ref 26.0–34.0)
MCHC: 31.5 g/dL (ref 30.0–36.0)
MCV: 85.9 fL (ref 80.0–100.0)
Platelets: 208 10*3/uL (ref 150–400)
RBC: 3.62 MIL/uL — ABNORMAL LOW (ref 3.87–5.11)
RDW: 14.3 % (ref 11.5–15.5)
WBC: 11.8 10*3/uL — ABNORMAL HIGH (ref 4.0–10.5)
nRBC: 0 % (ref 0.0–0.2)

## 2018-10-13 LAB — RPR: RPR Ser Ql: NONREACTIVE

## 2018-10-13 MED ORDER — DIBUCAINE 1 % RE OINT: 1.0000 "application " | TOPICAL_OINTMENT | RECTAL | Status: DC | PRN

## 2018-10-13 MED ORDER — ONDANSETRON HCL 4 MG PO TABS: 4.0000 mg | ORAL_TABLET | ORAL | Status: DC | PRN

## 2018-10-13 MED ORDER — ZOLPIDEM TARTRATE 5 MG PO TABS: 5.0000 mg | ORAL_TABLET | Freq: Every evening | ORAL | Status: DC | PRN

## 2018-10-13 MED ORDER — OXYCODONE-ACETAMINOPHEN 5-325 MG PO TABS: 1.0000 | ORAL_TABLET | ORAL | Status: DC | PRN

## 2018-10-13 MED ORDER — WITCH HAZEL-GLYCERIN EX PADS: 1.0000 "application " | MEDICATED_PAD | CUTANEOUS | Status: DC | PRN

## 2018-10-13 MED ORDER — PRENATAL MULTIVITAMIN CH
1.0000 | ORAL_TABLET | Freq: Every day | ORAL | Status: DC
  Administered 2018-10-13 – 2018-10-14 (×2): 1 via ORAL
  Filled 2018-10-13 (×2): qty 1

## 2018-10-13 MED ORDER — POLYSACCHARIDE IRON COMPLEX 150 MG PO CAPS
150.0000 mg | ORAL_CAPSULE | Freq: Every day | ORAL | Status: DC
  Administered 2018-10-14: 150 mg via ORAL
  Filled 2018-10-13: qty 1

## 2018-10-13 MED ORDER — SENNOSIDES-DOCUSATE SODIUM 8.6-50 MG PO TABS
2.0000 | ORAL_TABLET | ORAL | Status: DC
  Administered 2018-10-13 (×2): 2 via ORAL
  Filled 2018-10-13 (×2): qty 2

## 2018-10-13 MED ORDER — DIPHENHYDRAMINE HCL 25 MG PO CAPS: 25.0000 mg | ORAL_CAPSULE | Freq: Four times a day (QID) | ORAL | Status: DC | PRN

## 2018-10-13 MED ORDER — BENZOCAINE-MENTHOL 20-0.5 % EX AERO
1.0000 "application " | INHALATION_SPRAY | CUTANEOUS | Status: DC | PRN
  Filled 2018-10-13: qty 56

## 2018-10-13 MED ORDER — ACETAMINOPHEN 325 MG PO TABS
650.0000 mg | ORAL_TABLET | ORAL | Status: DC | PRN
  Administered 2018-10-13: 650 mg via ORAL
  Filled 2018-10-13: qty 2

## 2018-10-13 MED ORDER — ONDANSETRON HCL 4 MG/2ML IJ SOLN: 4.0000 mg | INTRAMUSCULAR | Status: DC | PRN

## 2018-10-13 MED ORDER — METHYLERGONOVINE MALEATE 0.2 MG PO TABS: 0.2000 mg | ORAL_TABLET | ORAL | Status: DC | PRN

## 2018-10-13 MED ORDER — OXYCODONE-ACETAMINOPHEN 5-325 MG PO TABS: 2.0000 | ORAL_TABLET | ORAL | Status: DC | PRN

## 2018-10-13 MED ORDER — SIMETHICONE 80 MG PO CHEW: 80.0000 mg | CHEWABLE_TABLET | ORAL | Status: DC | PRN

## 2018-10-13 MED ORDER — COCONUT OIL OIL
1.0000 "application " | TOPICAL_OIL | Status: DC | PRN
  Administered 2018-10-14: 1 via TOPICAL
  Filled 2018-10-13: qty 120

## 2018-10-13 MED ORDER — TETANUS-DIPHTH-ACELL PERTUSSIS 5-2.5-18.5 LF-MCG/0.5 IM SUSP: 0.5000 mL | Freq: Once | INTRAMUSCULAR | Status: DC

## 2018-10-13 MED ORDER — IBUPROFEN 600 MG PO TABS
600.0000 mg | ORAL_TABLET | Freq: Four times a day (QID) | ORAL | Status: DC
  Administered 2018-10-13 – 2018-10-14 (×6): 600 mg via ORAL
  Filled 2018-10-13 (×6): qty 1

## 2018-10-13 MED ORDER — MAGNESIUM OXIDE 400 (241.3 MG) MG PO TABS
400.0000 mg | ORAL_TABLET | Freq: Every day | ORAL | Status: DC
  Administered 2018-10-14: 400 mg via ORAL
  Filled 2018-10-13 (×2): qty 1

## 2018-10-13 MED ORDER — METHYLERGONOVINE MALEATE 0.2 MG/ML IJ SOLN: 0.2000 mg | INTRAMUSCULAR | Status: DC | PRN

## 2018-10-13 NOTE — Progress Notes (Signed)
PPD 1 VAVD with 2nd degree repair VBAC  S:  Reports feeling ok - tired             Tolerating po/ No nausea or vomiting             Bleeding is moderate             Pain controlled with Motrin             Up ad lib / ambulatory / voiding QS  Newborn Breast / female  O:   VS: BP (!) 99/58 (BP Location: Right Arm)   Pulse 91   Temp 98.3 F (36.8 C) (Oral)   Resp 18   SpO2 99%   Breastfeeding Unknown    LABS:             Recent Labs    10/12/18 1853 10/13/18 0532  WBC 10.7* 11.8*  HGB 12.1 9.8*  PLT 268 208               Blood type: --/--/A POS (12/15 1853)  Rubella: Immune (05/31 0000)                     Physical Exam:             Alert and oriented X3  Fundus: firm, non-tender, Ueven  Perineum: ice pack in place  Lochia: moderate  Extremities: no edema, no calf pain or tenderness   A: PPD # 1 SVD with 2nd degree repair - VBAC             ABL anemia  Doing well - stable status  P: Routine post partum orders             Iron and magnesium supplements  Anticipate DC in AM               Marlinda Mikeanya Tyrell Seifer CNM, MSN, [facility] 10/13/2018, 10:45 AM

## 2018-10-13 NOTE — Lactation Note (Signed)
This note was copied from a baby's chart. Lactation Consultation Note  Patient Name: Maria Fleming Reason for consult: Initial assessment;Term  Initial lactation visit with patient. Patient states that her goal is to exclusively breast feed. She asked for assistance with positioning and latch.   Baby was receiving a bath upon entry. We discussed basic lactation education such as normal infant feeding patterns on day 1, output expectations, feeding cues, and feeding frequency.   I assisted mother with hand expression. She was able to hand express her colostrum independently. I then helped position baby "Bebe ShaggySkye" on her right breast in football position. Skye practiced some "lick and learn" with a brief latch and intermittent suckling sequences. She then fell asleep and would not breast feed even with gentle pestering.  I showed patient how to feed her baby in cross cradle hold just to give her another feeding option (requested). Baby was too sleepy to latch, but patient reports that she feels that she understands this hold better now.  I recommended that patient feed baby 8-12 times a day on demand and reviewed feeding cues. I recommended skin to skin.  Patient to call with questions or requests for additional assistance.  I provided community breast feeding resources.  Maternal Data Has patient been taught Hand Expression?: Yes Does the patient have breastfeeding experience prior to this delivery?: Yes  Feeding Feeding Type: Breast Fed  LATCH Score Latch: Repeated attempts needed to sustain latch, nipple held in mouth throughout feeding, stimulation needed to elicit sucking reflex.  Audible Swallowing: None  Type of Nipple: Everted at rest and after stimulation  Comfort (Breast/Nipple): Soft / non-tender  Hold (Positioning): Assistance needed to correctly position infant at breast and maintain latch.  LATCH Score: 6  Interventions Interventions: Hand  express;Skin to skin;Breast feeding basics reviewed;Assisted with latch;Support pillows;Adjust position  Lactation Tools Discussed/Used     Consult Status Consult Status: Follow-up Date: 10/14/18 Follow-up type: In-patient    Walker ShadowHeather Azarria Balint Fleming, 2:49 PM

## 2018-10-14 ENCOUNTER — Inpatient Hospital Stay (HOSPITAL_COMMUNITY)
Admission: AD | Admit: 2018-10-14 | Payer: BLUE CROSS/BLUE SHIELD | Source: Ambulatory Visit | Admitting: Obstetrics & Gynecology

## 2018-10-14 SURGERY — Surgical Case
Anesthesia: Spinal

## 2018-10-14 MED ORDER — SENNOSIDES-DOCUSATE SODIUM 8.6-50 MG PO TABS: 2.0000 | ORAL_TABLET | ORAL | 0 refills | Status: AC

## 2018-10-14 MED ORDER — IBUPROFEN 600 MG PO TABS: 600.0000 mg | ORAL_TABLET | Freq: Four times a day (QID) | ORAL | 0 refills | Status: DC

## 2018-10-14 NOTE — Discharge Summary (Signed)
Obstetric Discharge Summary   Patient Name: Maria Fleming DOB: 08/21/1981 MRN: [id number]  Date of Admission: 10/12/2018 Date of Discharge: 10/14/2018 Date of Delivery: 10/12/18 Gestational Age at Delivery: [id number]  [facility] - Dr. Juliene PinaMody   Antepartum complications:  - Previous LTCS - Endometriosis  - Marginal Cord Insertion  - Solitary EIF Prenatal Labs:  ABO, Rh: A/Positive/-- (05/31 0000) Antibody: n (05/31 0000) Rubella: Immune (05/31 0000) RPR: Nonreactive (05/31 0000)  HBsAg: Negative (05/31 0000)  HIV: Non-reactive (05/31 0000)  GBS: Negative (11/19 0000)  Admitting Diagnosis: Active labor at term/ TOLAC  Secondary Diagnoses: Patient Active Problem List   Diagnosis Date Noted  . Postpartum care following vaginal delivery 10/13/2018  . VBAC / VAVD 12/15 10/13/2018  . Perineal laceration, second degree 10/13/2018  . Iron deficiency anemia of pregnancy 10/13/2018    Augmentation: AROM Complications: None  Date of Delivery: 10/12/18 Delivered By: Dr. Billy Coastaavon Delivery Type: vaginal birth after cesarean (VBAC)/ Vacuum assisted vaginal delivery  Anesthesia: epidural Placenta: sponatneous Laceration: 2nd degree  Episiotomy: none  Newborn Data: Live born female  Birth Weight: 7 lb 7.9 oz (3400 g) APGAR: 8, 9  Newborn Delivery   Birth date/time:  10/12/2018 22:28:00 Delivery type:  VBAC, Vacuum Assisted        Hospital/Postpartum Course  (Vaginal Delivery): Pt. Admitted for TOLAC in active labor. See notes and delivery summary for details.  She had  VBAC/VAVD for recurrent variable decelerations. Patient had an uncomplicated postpartum course.  By time of discharge on PPD#2, her pain was controlled on oral pain medications; she had appropriate lochia and was ambulating, voiding without difficulty and tolerating regular diet.  She was deemed stable for discharge to home.     Labs: CBC Latest Ref Rng & Units 10/13/2018 10/12/2018 09/03/2016  WBC  4.0 - 10.5 K/uL 11.8(H) 10.7(H) 16.4(H)  Hemoglobin 12.0 - 15.0 g/dL 4.0(J9.8(L) 81.112.1 9.1(Y9.8(L)  Hematocrit 36.0 - 46.0 % 31.1(L) 37.0 29.4(L)  Platelets 150 - 400 K/uL 208 268 240   A POS  Physical exam:  BP 100/62 (BP Location: Left Arm)   Pulse 89   Temp 97.6 F (36.4 C) (Oral)   Resp 18   SpO2 96%   Breastfeeding Unknown  General: alert and no distress Pulm: normal respiratory effort Lochia: appropriate Abdomen: soft, NT Uterine Fundus: firm, below umbilicus Perineum: healing well, no significant erythema, no significant edema Extremities: No evidence of DVT seen on physical exam. No lower extremity edema.   Disposition: stable, discharge to home Baby Feeding: breast milk Baby Disposition: home with mom  Contraception: Liletta IUD  Rh Immune globulin given: N/A Rubella vaccine given: N/A Tdap vaccine given in AP or PP setting: UTD Flu vaccine given in AP or PP setting: UTD   Plan:  Maria Fleming was discharged to home in good condition. [facility] for PP visit and Liletta IUD.  Discharge Instructions: Per After Visit Summary. Refer to After Visit Summary and [facility]  Activity: Advance as tolerated. Pelvic rest for 6 weeks.   Diet: Regular, Heart Healthy Discharge Medications: Allergies as of 10/14/2018      Reactions   Betadine [povidone Iodine] Rash      Medication List    TAKE these medications   acetaminophen 500 MG tablet Commonly known as:  TYLENOL Take 1,000 mg by mouth every 6 (six) hours as needed for moderate pain or headache.   ibuprofen 600 MG tablet Commonly known as:  ADVIL,MOTRIN Take 1 tablet (600 mg total) by mouth every 6 (six) hours.   IRON PO Take 1 tablet by mouth daily.   PRENATAL PO Take 2 tablets by mouth daily.   senna-docusate 8.6-50 MG tablet Commonly known as:  Senokot-S Take 2 tablets by mouth daily.            Discharge Care Instructions  (From  admission, onward)         Start     Ordered   10/14/18 0000  Discharge wound care:    Comments:  Sitz baths 3 times daily   10/14/18 2956         Outpatient follow up:  Follow-up Information    Shea Evans, MD. Schedule an appointment as soon as possible for a visit in 6 week(s).   Specialty:  Obstetrics and Gynecology Why:  Postpartum visit and Liletta IUD insertion  Contact information: [address] [place] [address] [phone]           Signed:  Carlean Jews, MSN, CNM [facility]

## 2018-10-14 NOTE — Plan of Care (Signed)
Progressing appropriately. Encouraged to call for assistance as needed, and for LATCH assessment.  

## 2018-10-14 NOTE — Progress Notes (Addendum)
PPD #2, VBAC/VAVD, 2nd degree repair, baby girl "Sky"  S:  Reports feeling good, sore             Tolerating po/ No nausea or vomiting / Denies dizziness or SOB             Bleeding is moderate             Pain controlled with Motrin and Tylenol              Up ad lib / ambulatory / voiding QS  Newborn breast feeding  O:               VS: BP 100/62 (BP Location: Left Arm)   Pulse 89   Temp 97.6 F (36.4 C) (Oral)   Resp 18   SpO2 96%   Breastfeeding Unknown    LABS:              Recent Labs    10/12/18 1853 10/13/18 0532  WBC 10.7* 11.8*  HGB 12.1 9.8*  PLT 268 208               Blood type: --/--/A POS (12/15 1853)  Rubella: Immune (05/31 0000)                     I&O: Intake/Output    None   Flu and Tdap UTD              Physical Exam:             Alert and oriented X3  Lungs: Clear and unlabored  Heart: regular rate and rhythm / no murmurs  Abdomen: soft, non-tender, non-distended              Fundus: firm, non-tender, U-1  Perineum: well-approximated 2nd degree laceration, no edema, no erythema, no ecchymosis  Lochia: small, not clots   Extremities: no edema, no calf pain or tenderness    A: PPD # 2, VBAC/VAVD  ABL Anemia - stable asymptomatic   2nd degree repair   Doing well - stable status  P: Routine post partum orders  Discharge home today  [facility] discharge book given, instructions, and warning s/s reviewed  Pt. Plans to continue home iron supplement  Sitz baths 3x daily  F/u with Dr. Juliene PinaMody in 6 weeks for PP visit and Liletta IUD   Carlean JewsMeredith Sigmon, MSN, CNM [facility]

## 2018-10-16 NOTE — Anesthesia Postprocedure Evaluation (Signed)
Anesthesia Post Note  Patient: Maria Fleming  Procedure(s) Performed: AN AD HOC LABOR EPIDURAL     Patient location during evaluation: Mother Baby Anesthesia Type: Epidural Level of consciousness: awake and alert Pain management: pain level controlled Vital Signs Assessment: post-procedure vital signs reviewed and stable Respiratory status: spontaneous breathing, nonlabored ventilation and respiratory function stable Cardiovascular status: stable Postop Assessment: no headache, no backache and epidural receding Anesthetic complications: no    Last Vitals: There were no vitals filed for this visit.  Last Pain: There were no vitals filed for this visit.               Kennieth RadFitzgerald, Dmetrius Ambs E

## 2020-01-22 ENCOUNTER — Ambulatory Visit: Payer: Self-pay | Attending: Internal Medicine

## 2020-01-22 DIAGNOSIS — Z23 Encounter for immunization: Secondary | ICD-10-CM

## 2020-01-22 NOTE — Progress Notes (Signed)
   Covid-19 Vaccination Clinic  Name:  Maria Fleming    MRN: [id number] DOB: September 16, 1981  01/22/2020  [name] was observed post Covid-19 immunization for 15 minutes without incident. She was provided with Vaccine Information Sheet and instruction to access the V-Safe system.   [name] was instructed to call 911 with any severe reactions post vaccine: Marland Kitchen Difficulty breathing  . Swelling of face and throat  . A fast heartbeat  . A bad rash all over body  . Dizziness and weakness   Immunizations Administered    Name Date Dose VIS Date Route   Pfizer COVID-19 Vaccine 01/22/2020  4:52 PM 0.3 mL 10/09/2019 Intramuscular   Manufacturer: [employer], [employer]   Lot: [id number]   NDC: [address]-0

## 2020-02-17 ENCOUNTER — Ambulatory Visit: Payer: Self-pay | Attending: Internal Medicine

## 2020-02-17 DIAGNOSIS — Z23 Encounter for immunization: Secondary | ICD-10-CM

## 2020-02-17 NOTE — Progress Notes (Signed)
   Covid-19 Vaccination Clinic  Name:  Chereese Cilento    MRN: [id number] DOB: 1981/04/06  02/17/2020  Ms. Frese was observed post Covid-19 immunization for 15 minutes without incident. She was provided with Vaccine Information Sheet and instruction to access the V-Safe system.   Ms. Nickelson was instructed to call 911 with any severe reactions post vaccine: Marland Kitchen Difficulty breathing  . Swelling of face and throat  . A fast heartbeat  . A bad rash all over body  . Dizziness and weakness   Immunizations Administered    Name Date Dose VIS Date Route   Pfizer COVID-19 Vaccine 02/17/2020  4:17 PM 0.3 mL 12/23/2018 Intramuscular   Manufacturer: [employer], [employer]   Lot: [id number]   NDC: [address]-6

## 2020-03-03 ENCOUNTER — Other Ambulatory Visit: Payer: Self-pay

## 2020-03-03 ENCOUNTER — Ambulatory Visit
Admission: EM | Admit: 2020-03-03 | Discharge: 2020-03-03 | Disposition: A | Discharge status: Home / Self Care | Payer: BC Managed Care – PPO

## 2020-03-03 ENCOUNTER — Encounter: Payer: Self-pay | Admitting: Emergency Medicine

## 2020-03-03 DIAGNOSIS — M79644 Pain in right finger(s): Secondary | ICD-10-CM | POA: Diagnosis not present

## 2020-03-03 MED ORDER — IBUPROFEN 800 MG PO TABS
800.0000 mg | ORAL_TABLET | Freq: Once | ORAL | Status: AC
  Administered 2020-03-03: 11:00:00 800 mg via ORAL

## 2020-03-03 NOTE — Discharge Instructions (Addendum)
As we discussed, go to the walk-in clinic at [facility].

## 2020-03-03 NOTE — ED Triage Notes (Signed)
Patient in today c/o swelling and pain in the joint of her right thumb since yesterday. 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 Patient has tried OTC Ibuprofen and ice.

## 2020-03-03 NOTE — ED Provider Notes (Signed)
Maria Fleming    CSN: [id number] Arrival date & time: 03/03/20  1012      History   Chief Complaint Chief Complaint  Patient presents with  . Joint Swelling    right thumb    HPI Maria Fleming is a 39 y.o. female.   Patient presents with pain and swelling of her right thumb since yesterday.  No known injury.  Patient repetitively uses her thumb at work.  She denies fever, chills, redness, bruising, wounds, rash, numbness, weakness, or other symptoms.  The pain is worse with bending her thumb.  Treatment attempted at home with ice packs and ibuprofen.    The history is provided by the patient.    Past Medical History:  Diagnosis Date  . Cervix abnormality    precancerous cells  . Condyloma acuminata   . Endometriosis   . Enlarged thyroid   . Gastroenteritis    has been seen by specialist, unsure of official dx  . Hx of chlamydia infection   . No pertinent past medical history   . Ovarian cyst   . Postpartum care following cesarean delivery (11/5) 09/03/2016  . Vaginal Pap smear, abnormal     Patient Active Problem List   Diagnosis Date Noted  . Postpartum care following vaginal delivery 10/13/2018  . VBAC / VAVD 12/15 10/13/2018  . Perineal laceration, second degree 10/13/2018  . Iron deficiency anemia of pregnancy 10/13/2018    Past Surgical History:  Procedure Laterality Date  . BREAST LUMPECTOMY    . BUNIONECTOMY    . CESAREAN SECTION N/A 09/02/2016   Procedure: CESAREAN SECTION;  Surgeon: Shea Evans, MD;  Location: [facility] BIRTHING SUITES;  Service: Obstetrics;  Laterality: N/A;  . CYSTECTOMY W/ URETEROSIGMOIDOSTOMY  02/24/2015  . OVARIAN CYST REMOVAL  08/21/2012   Procedure: OVARIAN CYSTECTOMY;  Surgeon: Robley Fries, MD;  Location: [facility] ORS;  Service: Gynecology;  Laterality: Bilateral;  . ROBOTIC ASSISTED LAPAROSCOPIC LYSIS OF ADHESION N/A 02/24/2015   Procedure: ROBOTIC ASSISTED LAPAROSCOPIC LYSIS OF ADHESION/Ablation of Endometriosis; bilateral  endometrioma cyst excision;  Surgeon: Shea Evans, MD;  Location: [facility] ORS;  Service: Gynecology;  Laterality: N/A;  3 1/2 hrs.  . WISDOM TOOTH EXTRACTION      OB History    Gravida  2   Para  2   Term  2   Preterm      AB      Living  2     SAB      TAB      Ectopic      Multiple  0   Live Births  2            Home Medications    Prior to Admission medications   Medication Sig Start Date End Date Taking? Authorizing Provider  ibuprofen (ADVIL,MOTRIN) 600 MG tablet Take 1 tablet (600 mg total) by mouth every 6 (six) hours. 10/14/18  Yes Sigmon, Scarlette Slice, CNM  Multiple Vitamin (MULTIVITAMIN) tablet Take 1 tablet by mouth daily.   Yes [provider]  acetaminophen (TYLENOL) 500 MG tablet Take 1,000 mg by mouth every 6 (six) hours as needed for moderate pain or headache.    [provider]  IRON PO Take 1 tablet by mouth daily.    [provider]  Prenatal Vit-Fe Fumarate-FA (PRENATAL PO) Take 2 tablets by mouth daily.    [provider]  senna-docusate (SENOKOT-S) 8.6-50 MG tablet Take 2 tablets by mouth daily. 10/14/18   Carlean Jews  C, CNM    Family History Family History  Problem Relation Age of Onset  . Diabetes Mother   . Hypertension Mother   . Cancer Father        prostate  . Diabetes Father        borderline    Social History Social History   Tobacco Use  . Smoking status: Never Smoker  . Smokeless tobacco: Never Used  Substance Use Topics  . Alcohol use: Yes    Alcohol/week: 1.0 standard drinks    Types: 1 Glasses of wine per week    Comment: occasonally  . Drug use: No     Allergies   Betadine [povidone iodine]   Review of Systems Review of Systems  Constitutional: Negative for chills and fever.  HENT: Negative for ear pain and sore throat.   Eyes: Negative for pain and visual disturbance.  Respiratory: Negative for cough and shortness of breath.   Cardiovascular: Negative for chest  pain and palpitations.  Gastrointestinal: Negative for abdominal pain and vomiting.  Genitourinary: Negative for dysuria and hematuria.  Musculoskeletal: Positive for arthralgias and joint swelling. Negative for back pain.  Skin: Negative for color change and rash.  Neurological: Negative for seizures and syncope.  All other systems reviewed and are negative.    Physical Exam Triage Vital Signs ED Triage Vitals  Enc Vitals Group     BP 03/03/20 1015 118/76     Pulse Rate 03/03/20 1015 60     Resp 03/03/20 1015 18     Temp 03/03/20 1015 98.3 F (36.8 C)     Temp Source 03/03/20 1015 Oral     SpO2 03/03/20 1015 98 %     Weight 03/03/20 1017 170 lb (77.1 kg)     Height 03/03/20 1017 5' 6.5" (1.689 m)     Head Circumference --      Peak Flow --      Pain Score 03/03/20 1016 8     Pain Loc --      Pain Edu? --      Excl. in GC? --    No data found.  Updated Vital Signs BP 118/76 (BP Location: Left Arm)   Pulse 60   Temp 98.3 F (36.8 C) (Oral)   Resp 18   Ht 5' 6.5" (1.689 m)   Wt 170 lb (77.1 kg)   LMP 02/24/2020 (Exact Date)   SpO2 98%   BMI 27.03 kg/m   Visual Acuity Right Eye Distance:   Left Eye Distance:   Bilateral Distance:    Right Eye Near:   Left Eye Near:    Bilateral Near:     Physical Exam Vitals and nursing note reviewed.  Constitutional:      General: She is not in acute distress.    Appearance: She is well-developed.  HENT:     Head: Normocephalic and atraumatic.     Mouth/Throat:     Mouth: Mucous membranes are moist.  Eyes:     Conjunctiva/sclera: Conjunctivae normal.  Cardiovascular:     Rate and Rhythm: Normal rate and regular rhythm.     Heart sounds: No murmur.  Pulmonary:     Effort: Pulmonary effort is normal. No respiratory distress.     Breath sounds: Normal breath sounds.  Abdominal:     Palpations: Abdomen is soft.     Tenderness: There is no abdominal tenderness.  Musculoskeletal:        General: Swelling and  tenderness present. No signs  of injury.       Hands:     Cervical back: Neck supple.  Skin:    General: Skin is warm and dry.     Capillary Refill: Capillary refill takes less than 2 seconds.     Findings: No bruising, erythema, lesion or rash.  Neurological:     General: No focal deficit present.     Mental Status: She is alert and oriented to person, place, and time.     Sensory: No sensory deficit.     Motor: No weakness.  Psychiatric:        Mood and Affect: Mood normal.        Behavior: Behavior normal.      UC Treatments / Results  Labs (all labs ordered are listed, but only abnormal results are displayed) Labs Reviewed - No data to display  EKG   Radiology No results found.  Procedures Procedures (including critical care time)  Medications Ordered in UC Medications  ibuprofen (ADVIL) tablet 800 mg (800 mg Oral Given 03/03/20 1050)    Initial Impression / Assessment and Plan / UC Course  I have reviewed the triage vital signs and the nursing notes.  Pertinent labs & imaging results that were available during my care of the patient were reviewed by me and considered in my medical decision making (see chart for details).   Right thumb pain and swelling.  Treated with ibuprofen here.  Discussed treatment options with patient; discussed that we do not have radiology onsite at this time but can send her for outpatient x-ray; discussed option for walk-in clinic at [facility].  Patient opts to go to walk-in clinic at [facility].     Final Clinical Impressions(s) / UC Diagnoses   Final diagnoses:  Thumb pain, right     Discharge Instructions     As we discussed, go to the walk-in clinic at [facility].      ED Prescriptions    None     PDMP not reviewed this encounter.   Sharion Balloon, NP 03/03/20 1101

## 2020-03-10 DIAGNOSIS — M79644 Pain in right finger(s): Secondary | ICD-10-CM | POA: Diagnosis not present

## 2020-03-24 DIAGNOSIS — M79644 Pain in right finger(s): Secondary | ICD-10-CM | POA: Diagnosis not present

## 2020-05-09 DIAGNOSIS — Z01419 Encounter for gynecological examination (general) (routine) without abnormal findings: Secondary | ICD-10-CM | POA: Diagnosis not present

## 2020-05-09 DIAGNOSIS — N760 Acute vaginitis: Secondary | ICD-10-CM | POA: Diagnosis not present

## 2020-05-09 DIAGNOSIS — N898 Other specified noninflammatory disorders of vagina: Secondary | ICD-10-CM | POA: Diagnosis not present

## 2020-05-09 DIAGNOSIS — Z6827 Body mass index (BMI) 27.0-27.9, adult: Secondary | ICD-10-CM | POA: Diagnosis not present

## 2020-08-25 DIAGNOSIS — Z1211 Encounter for screening for malignant neoplasm of colon: Secondary | ICD-10-CM | POA: Diagnosis not present

## 2020-10-26 DIAGNOSIS — Z20828 Contact with and (suspected) exposure to other viral communicable diseases: Secondary | ICD-10-CM | POA: Diagnosis not present

## 2024-07-31 ENCOUNTER — Ambulatory Visit (HOSPITAL_COMMUNITY)
Admission: EM | Admit: 2024-07-31 | Discharge: 2024-07-31 | Disposition: A | Discharge status: Home / Self Care | Attending: Emergency Medicine | Admitting: Emergency Medicine

## 2024-07-31 ENCOUNTER — Encounter (HOSPITAL_COMMUNITY): Payer: Self-pay

## 2024-07-31 DIAGNOSIS — K279 Peptic ulcer, site unspecified, unspecified as acute or chronic, without hemorrhage or perforation: Secondary | ICD-10-CM | POA: Diagnosis not present

## 2024-07-31 MED ORDER — LIDOCAINE VISCOUS HCL 2 % MT SOLN
15.0000 mL | Freq: Once | OROMUCOSAL | Status: AC
  Administered 2024-07-31: 15 mL via OROMUCOSAL

## 2024-07-31 MED ORDER — PANTOPRAZOLE SODIUM 40 MG PO TBEC: 40.0000 mg | DELAYED_RELEASE_TABLET | Freq: Every day | ORAL | 0 refills | Status: AC

## 2024-07-31 MED ORDER — DICYCLOMINE HCL 20 MG PO TABS: 20.0000 mg | ORAL_TABLET | Freq: Two times a day (BID) | ORAL | 0 refills | Status: AC | PRN

## 2024-07-31 MED ORDER — ALUM & MAG HYDROXIDE-SIMETH 200-200-20 MG/5ML PO SUSP
ORAL | Status: AC
  Filled 2024-07-31: qty 30

## 2024-07-31 MED ORDER — ALUM & MAG HYDROXIDE-SIMETH 200-200-20 MG/5ML PO SUSP
30.0000 mL | Freq: Once | ORAL | Status: AC
  Administered 2024-07-31: 30 mL via ORAL

## 2024-07-31 MED ORDER — LIDOCAINE VISCOUS HCL 2 % MT SOLN
OROMUCOSAL | Status: AC
  Filled 2024-07-31: qty 15

## 2024-07-31 NOTE — Discharge Instructions (Signed)
 Protonix  (pantoprazole ) once every morning for the next 14 days. It can take 1-3 days to start working. Take first thing in the morning on an empty stomach.  Remain upright for 30 minutes after taking medicine.  Please take the full 14-day course.  Bentyl can be used twice daily as needed for stomach spasm/cramping.   Bland diet for the next 2 weeks.  Avoid fatty, greasy, citrusy, spicy, caffeine and alcohol as these can worsen symptoms.  Do not use any ibuprofen /Advil  or other NSAIDs as these can worsen symptoms. Tylenol  is safe to use.   Please follow up with your gastroenterologist.  If at any point your symptoms worsen, including severe pain or inability to tolerate fluids, please go directly to the emergency department.

## 2024-07-31 NOTE — ED Provider Notes (Signed)
 [facility]    CSN: [id number] Arrival date & time: 07/31/24  1723      History   Chief Complaint Chief Complaint  Patient presents with   Abdominal Pain    HPI Maria Fleming is a 43 y.o. female.  Epigastric abdominal pain for several days. Worse at night.  Pain increases right when she eats.  Currently rating 8/10 pain. No nausea or vomiting. Normal urination and BM  She also notes a squeezing, spasm sensation in the epigastric area, occurring intermittently  Has tried antacid, tylenol , pepto  Has been taking ibuprofen  for menstrual cycle, and Excedrin   History of this over 10 years ago She has a GI specialist - sees them in 4 days on Tuesday 10/7  Past Medical History:  Diagnosis Date   Cervix abnormality    precancerous cells   Condyloma acuminata    Endometriosis    Enlarged thyroid    Gastroenteritis    has been seen by specialist, unsure of official dx   Hx of chlamydia infection    No pertinent past medical history    Ovarian cyst    Postpartum care following cesarean delivery (11/5) 09/03/2016   Vaginal Pap smear, abnormal     Patient Active Problem List   Diagnosis Date Noted   Postpartum care following vaginal delivery 10/13/2018   VBAC / VAVD 12/15 10/13/2018   Perineal laceration, second degree 10/13/2018   Iron  deficiency anemia of pregnancy 10/13/2018    Past Surgical History:  Procedure Laterality Date   BREAST LUMPECTOMY     BUNIONECTOMY     CESAREAN SECTION N/A 09/02/2016   Procedure: CESAREAN SECTION;  Surgeon: Robbi Render, MD;  Location: [facility];  Service: Obstetrics;  Laterality: N/A;   CYSTECTOMY W/ URETEROSIGMOIDOSTOMY  02/24/2015   OVARIAN CYST REMOVAL  08/21/2012   Procedure: OVARIAN CYSTECTOMY;  Surgeon: Robbi JONELLE Render, MD;  Location: [facility];  Service: Gynecology;  Laterality: Bilateral;   ROBOTIC ASSISTED LAPAROSCOPIC LYSIS OF ADHESION N/A 02/24/2015   Procedure: ROBOTIC ASSISTED LAPAROSCOPIC LYSIS OF  ADHESION/Ablation of Endometriosis; bilateral endometrioma cyst excision;  Surgeon: Robbi Render, MD;  Location: [facility];  Service: Gynecology;  Laterality: N/A;  3 1/2 hrs.   WISDOM TOOTH EXTRACTION      OB History     Gravida  2   Para  2   Term  2   Preterm      AB      Living  2      SAB      IAB      Ectopic      Multiple  0   Live Births  2            Home Medications    Prior to Admission medications   Medication Sig Start Date End Date Taking? Authorizing Provider  dicyclomine (BENTYL) 20 MG tablet Take 1 tablet (20 mg total) by mouth 2 (two) times daily as needed for spasms. 07/31/24  Yes Saprina Chuong, [place], PA-C  Multiple Vitamin (MULTIVITAMIN) tablet Take 1 tablet by mouth daily.   Yes [provider]  pantoprazole  (PROTONIX ) 40 MG tablet Take 1 tablet (40 mg total) by mouth daily for 14 days. 07/31/24 08/14/24 Yes Jaquana Geiger, [place], PA-C  acetaminophen  (TYLENOL ) 500 MG tablet Take 1,000 mg by mouth every 6 (six) hours as needed for moderate pain or headache.    [provider]  IRON  PO Take 1 tablet by mouth daily.    [provider]  Prenatal Vit-Fe Fumarate-FA (PRENATAL PO) Take 2 tablets by mouth daily.    [provider]  senna-docusate (SENOKOT-S) 8.6-50 MG tablet Take 2 tablets by mouth daily. 10/14/18   Elner Hoy BROCKS, CNM    Family History Family History  Problem Relation Age of Onset   Diabetes Mother    Hypertension Mother    Cancer Father        prostate   Diabetes Father        borderline    Social History Social History   Tobacco Use   Smoking status: Never   Smokeless tobacco: Never  Vaping Use   Vaping status: Never Used  Substance Use Topics   Alcohol use: Yes    Alcohol/week: 1.0 standard drink of alcohol    Types: 1 Glasses of wine per week    Comment: occasonally   Drug use: No     Allergies   Betadine [povidone iodine]   Review of Systems Review of Systems   Gastrointestinal:  Positive for abdominal pain.   As per HPI  Physical Exam Triage Vital Signs ED Triage Vitals  Encounter Vitals Group     BP 07/31/24 1752 (!) 147/86     Girls Systolic BP Percentile --      Girls Diastolic BP Percentile --      Boys Systolic BP Percentile --      Boys Diastolic BP Percentile --      Pulse Rate 07/31/24 1752 62     Resp 07/31/24 1752 16     Temp 07/31/24 1752 98 F (36.7 C)     Temp Source 07/31/24 1752 Oral     SpO2 07/31/24 1752 98 %     Weight 07/31/24 1752 170 lb (77.1 kg)     Height 07/31/24 1752 5' 6.5 (1.689 m)     Head Circumference --      Peak Flow --      Pain Score 07/31/24 1750 8     Pain Loc --      Pain Education --      Exclude from Growth Chart --    No data found.  Updated Vital Signs BP (!) 147/86 (BP Location: Right Arm)   Pulse 62   Temp 98 F (36.7 C) (Oral)   Resp 16   Ht 5' 6.5 (1.689 m)   Wt 170 lb (77.1 kg)   LMP 07/27/2024 (Approximate)   SpO2 98%   Breastfeeding No   BMI 27.03 kg/m    Physical Exam Vitals and nursing note reviewed.  Constitutional:      Appearance: Normal appearance. She is not ill-appearing or diaphoretic.  HENT:     Mouth/Throat:     Mouth: Mucous membranes are moist.     Pharynx: Oropharynx is clear.  Eyes:     Conjunctiva/sclera: Conjunctivae normal.  Cardiovascular:     Rate and Rhythm: Normal rate and regular rhythm.     Heart sounds: Normal heart sounds.  Pulmonary:     Effort: Pulmonary effort is normal. No respiratory distress.     Breath sounds: Normal breath sounds.  Abdominal:     General: Bowel sounds are normal.     Palpations: Abdomen is soft.     Tenderness: There is abdominal tenderness in the epigastric area. There is no right CVA tenderness, left CVA tenderness, guarding or rebound.  Musculoskeletal:        General: Normal range of motion.  Skin:    General: Skin is  warm and dry.  Neurological:     Mental Status: She is alert and oriented to  person, place, and time.     UC Treatments / Results  Labs (all labs ordered are listed, but only abnormal results are displayed) Labs Reviewed - No data to display  EKG  Radiology No results found.  Procedures Procedures  Medications Ordered in UC Medications  alum & mag hydroxide-simeth (MAALOX/MYLANTA) 200-200-20 MG/5ML suspension 30 mL (30 mLs Oral Given 07/31/24 1814)  lidocaine  (XYLOCAINE ) 2 % viscous mouth solution 15 mL (15 mLs Mouth/Throat Given 07/31/24 1814)    Initial Impression / Assessment and Plan / UC Course  I have reviewed the triage vital signs and the nursing notes.  Pertinent labs & imaging results that were available during my care of the patient were reviewed by me and considered in my medical decision making (see chart for details).  GI cocktail given with almost full improvement.  Pain 1/10.  Discussed with patient I believe she has a peptic ulcer.  Protonix  daily for 14 days.  Advised foods, drinks, and medications to avoid that would further worsen symptoms. Have also prescribed Bentyl for spasm sensation. She has a GI that she follows with, her next visit is in 4 days.  In the meantime understands return and ED precautions.  Final Clinical Impressions(s) / UC Diagnoses   Final diagnoses:  Peptic ulcer     Discharge Instructions      Protonix  (pantoprazole ) once every morning for the next 14 days. It can take 1-3 days to start working. Take first thing in the morning on an empty stomach.  Remain upright for 30 minutes after taking medicine.  Please take the full 14-day course.  Bentyl can be used twice daily as needed for stomach spasm/cramping.   Bland diet for the next 2 weeks.  Avoid fatty, greasy, citrusy, spicy, caffeine and alcohol as these can worsen symptoms.  Do not use any ibuprofen /Advil  or other NSAIDs as these can worsen symptoms. Tylenol  is safe to use.   Please follow up with your gastroenterologist.  If at any point your  symptoms worsen, including severe pain or inability to tolerate fluids, please go directly to the emergency department.     ED Prescriptions     Medication Sig Dispense Auth. Provider   pantoprazole  (PROTONIX ) 40 MG tablet Take 1 tablet (40 mg total) by mouth daily for 14 days. 14 tablet Zoltan Genest, PA-C   dicyclomine (BENTYL) 20 MG tablet Take 1 tablet (20 mg total) by mouth 2 (two) times daily as needed for spasms. 30 tablet Carsen Machi, [place], PA-C      PDMP not reviewed this encounter.   Dalyah Pla, [place], [place] 07/31/24 1920

## 2024-07-31 NOTE — ED Triage Notes (Signed)
 Onset of period 3 - 4 days ago and that morning to Excedrin migraine that helped. State the next day took 4 Ibuprofen . On the third night Patient woke up with abdominal discomfort. States it starts off feeling like hunger pains but increases when she does eat.   Tried Pepto bismol, antacid, and Tylenol  with no relief.

## 2024-08-04 ENCOUNTER — Other Ambulatory Visit (HOSPITAL_COMMUNITY): Payer: Self-pay | Admitting: Gastroenterology

## 2024-08-04 DIAGNOSIS — R1011 Right upper quadrant pain: Secondary | ICD-10-CM

## 2024-08-10 ENCOUNTER — Encounter (HOSPITAL_COMMUNITY): Payer: Self-pay

## 2024-08-10 ENCOUNTER — Ambulatory Visit (HOSPITAL_COMMUNITY)
Admission: RE | Admit: 2024-08-10 | Discharge: 2024-08-10 | Disposition: A | Discharge status: Home / Self Care | Source: Ambulatory Visit | Attending: Gastroenterology | Admitting: Gastroenterology

## 2024-08-10 DIAGNOSIS — R1011 Right upper quadrant pain: Secondary | ICD-10-CM | POA: Diagnosis present

## 2024-08-10 MED ORDER — TECHNETIUM TC 99M MEBROFENIN IV KIT
5.0000 | PACK | Freq: Once | INTRAVENOUS | Status: AC | PRN
  Administered 2024-08-10: 5.3 via INTRAVENOUS
